# Patient Record
Sex: Male | Born: 1985 | Race: Black or African American | Hispanic: No | Marital: Single
Health system: Southern US, Community
[De-identification: ages and names within clinical notes are randomized; demographics above are authoritative.]

---
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
## 2015-02-04 ENCOUNTER — Encounter (HOSPITAL_COMMUNITY): Payer: Self-pay | Admitting: Emergency Medicine

## 2015-02-04 ENCOUNTER — Other Ambulatory Visit (HOSPITAL_COMMUNITY)
Admission: RE | Admit: 2015-02-04 | Discharge: 2015-02-04 | Disposition: A | Discharge status: Home / Self Care | Payer: Self-pay | Source: Ambulatory Visit | Attending: Family Medicine | Admitting: Family Medicine

## 2015-02-04 ENCOUNTER — Emergency Department (INDEPENDENT_AMBULATORY_CARE_PROVIDER_SITE_OTHER)
Admission: EM | Admit: 2015-02-04 | Discharge: 2015-02-04 | Disposition: A | Discharge status: Home / Self Care | Payer: Self-pay | Source: Home / Self Care | Attending: Family Medicine | Admitting: Family Medicine

## 2015-02-04 DIAGNOSIS — Z113 Encounter for screening for infections with a predominantly sexual mode of transmission: Secondary | ICD-10-CM | POA: Insufficient documentation

## 2015-02-04 DIAGNOSIS — Z202 Contact with and (suspected) exposure to infections with a predominantly sexual mode of transmission: Secondary | ICD-10-CM

## 2015-02-04 MED ORDER — METRONIDAZOLE 500 MG PO TABS: 1000.0000 mg | ORAL_TABLET | Freq: Two times a day (BID) | ORAL | Status: DC

## 2015-02-04 NOTE — ED Provider Notes (Signed)
CSN: [id number]     Arrival date & time 02/04/15  1450 History   First MD Initiated Contact with Patient 02/04/15 1614     Chief Complaint  Patient presents with  . Exposure to STD   (Consider location/radiation/quality/duration/timing/severity/associated sxs/prior Treatment) HPI      29 year old male presents complaining of exposure to trichomonas. He was called by a sexual partner and was told that they tested positive for trichomonas. He had intercourse with this person one week ago. He denies any symptoms. He is heterosexual, only has intercourse with females. He was recently incarcerated, had negative STD tests after being released from prison.  History reviewed. No pertinent past medical history. History reviewed. No pertinent past surgical history. No family history on file. History  Substance Use Topics  . Smoking status: Never Smoker   . Smokeless tobacco: Not on file  . Alcohol Use: No    Review of Systems  Constitutional: Negative for fever, chills and fatigue.  HENT: Negative for sore throat.   Eyes: Negative for visual disturbance.  Respiratory: Negative for cough and shortness of breath.   Cardiovascular: Negative for chest pain, palpitations and leg swelling.  Gastrointestinal: Negative for nausea, vomiting, abdominal pain, diarrhea and constipation.  Genitourinary: Negative for dysuria, urgency, frequency and hematuria.  Musculoskeletal: Negative for myalgias, arthralgias, neck pain and neck stiffness.  Skin: Negative for rash.  Neurological: Negative for dizziness, weakness and light-headedness.  All other systems reviewed and are negative.   Allergies  Review of patient's allergies indicates no known allergies.  Home Medications   Prior to Admission medications   Medication Sig Start Date End Date Taking? Authorizing Provider  metroNIDAZOLE (FLAGYL) 500 MG tablet Take 2 tablets (1,000 mg total) by mouth every 12 (twelve) hours. 02/04/15   Adrian BlackwaterZachary H Alenna Russell, PA-C    BP 120/72 mmHg  Pulse 58  Temp(Src) 97.6 F (36.4 C) (Oral)  Resp 18  SpO2 95% Physical Exam  Constitutional: He is oriented to person, place, and time. He appears well-developed and well-nourished. No distress.  HENT:  Head: Normocephalic.  Pulmonary/Chest: Effort normal. No respiratory distress.  Neurological: He is alert and oriented to person, place, and time. Coordination normal.  Skin: Skin is warm and dry. No rash noted. He is not diaphoretic.  Psychiatric: He has a normal mood and affect. Judgment normal.  Nursing note and vitals reviewed.   ED Course  Procedures (including critical care time) Labs Review Labs Reviewed  RPR  HIV ANTIBODY (ROUTINE TESTING)  URINE CYTOLOGY ANCILLARY ONLY    Imaging Review No results found.   MDM   1. Exposure to trichomonas    Urine cytology, HIV, and RPR have been sent today. Treat with metronidazole, 1 g every 12 hours for 2 doses. Follow-up when necessary  Meds ordered this encounter  Medications  . metroNIDAZOLE (FLAGYL) 500 MG tablet    Sig: Take 2 tablets (1,000 mg total) by mouth every 12 (twelve) hours.    Dispense:  4 tablet    Refill:  0       Graylon GoodZachary H Tekia Waterbury, PA-C 02/04/15 2152

## 2015-02-04 NOTE — Discharge Instructions (Signed)
Sexually Transmitted Disease °A sexually transmitted disease (STD) is a disease or infection that may be passed (transmitted) from person to person, usually during sexual activity. This may happen by way of saliva, semen, blood, vaginal mucus, or urine. Common STDs include:  °· Gonorrhea.   °· Chlamydia.   °· Syphilis.   °· HIV and AIDS.   °· Genital herpes.   °· Hepatitis B and C.   °· Trichomonas.   °· Human papillomavirus (HPV).   °· Pubic lice.   °· Scabies. °· Mites. °· Bacterial vaginosis. °WHAT ARE CAUSES OF STDs? °An STD may be caused by bacteria, a virus, or parasites. STDs are often transmitted during sexual activity if one person is infected. However, they may also be transmitted through nonsexual means. STDs may be transmitted after:  °· Sexual intercourse with an infected person.   °· Sharing sex toys with an infected person.   °· Sharing needles with an infected person or using unclean piercing or tattoo needles. °· Having intimate contact with the genitals, mouth, or rectal areas of an infected person.   °· Exposure to infected fluids during birth. °WHAT ARE THE SIGNS AND SYMPTOMS OF STDs? °Different STDs have different symptoms. Some people may not have any symptoms. If symptoms are present, they may include:  °· Painful or bloody urination.   °· Pain in the pelvis, abdomen, vagina, anus, throat, or eyes.   °· A skin rash, itching, or irritation. °· Growths, ulcerations, blisters, or sores in the genital and anal areas. °· Abnormal vaginal discharge with or without bad odor.   °· Penile discharge in men.   °· Fever.   °· Pain or bleeding during sexual intercourse.   °· Swollen glands in the groin area.   °· Yellow skin and eyes (jaundice). This is seen with hepatitis.   °· Swollen testicles. °· Infertility. °· Sores and blisters in the mouth. °HOW ARE STDs DIAGNOSED? °To make a diagnosis, your health care provider may:  °· Take a medical history.   °· Perform a physical exam.   °· Take a sample of  any discharge to examine. °· Swab the throat, cervix, opening to the penis, rectum, or vagina for testing. °· Test a sample of your first morning urine.   °· Perform blood tests.   °· Perform a Pap test, if this applies.   °· Perform a colposcopy.   °· Perform a laparoscopy.   °HOW ARE STDs TREATED? ° Treatment depends on the STD. Some STDs may be treated but not cured.  °· Chlamydia, gonorrhea, trichomonas, and syphilis can be cured with antibiotic medicine.   °· Genital herpes, hepatitis, and HIV can be treated, but not cured, with prescribed medicines. The medicines lessen symptoms.   °· Genital warts from HPV can be treated with medicine or by freezing, burning (electrocautery), or surgery. Warts may come back.   °· HPV cannot be cured with medicine or surgery. However, abnormal areas may be removed from the cervix, vagina, or vulva.   °· If your diagnosis is confirmed, your recent sexual partners need treatment. This is true even if they are symptom-free or have a negative culture or evaluation. They should not have sex until their health care providers say it is okay. °HOW CAN I REDUCE MY RISK OF GETTING AN STD? °Take these steps to reduce your risk of getting an STD: °· Use latex condoms, dental dams, and water-soluble lubricants during sexual activity. Do not use petroleum jelly or oils. °· Avoid having multiple sex partners. °· Do not have sex with someone who has other sex partners. °· Do not have sex with anyone you do not know or who is at   high risk for an STD. °· Avoid risky sex practices that can break your skin. °· Do not have sex if you have open sores on your mouth or skin. °· Avoid drinking too much alcohol or taking illegal drugs. Alcohol and drugs can affect your judgment and put you in a vulnerable position. °· Avoid engaging in oral and anal sex acts. °· Get vaccinated for HPV and hepatitis. If you have not received these vaccines in the past, talk to your health care provider about whether one  or both might be right for you.   °· If you are at risk of being infected with HIV, it is recommended that you take a prescription medicine daily to prevent HIV infection. This is called pre-exposure prophylaxis (PrEP). You are considered at risk if: °· You are a man who has sex with other men (MSM). °· You are a heterosexual man or woman and are sexually active with more than one partner. °· You take drugs by injection. °· You are sexually active with a partner who has HIV. °· Talk with your health care provider about whether you are at high risk of being infected with HIV. If you choose to begin PrEP, you should first be tested for HIV. You should then be tested every 3 months for as long as you are taking PrEP.   °WHAT SHOULD I DO IF I THINK I HAVE AN STD? °· See your health care provider.   °· Tell your sexual partner(s). They should be tested and treated for any STDs. °· Do not have sex until your health care provider says it is okay.  °WHEN SHOULD I GET IMMEDIATE MEDICAL CARE? °Contact your health care provider right away if:  °· You have severe abdominal pain. °· You are a man and notice swelling or pain in your testicles. °· You are a woman and notice swelling or pain in your vagina. °Document Released: 01/05/2003 Document Revised: 10/20/2013 Document Reviewed: 05/05/2013 °ExitCare® Patient Information ©2015 [employer]. This information is not intended to replace advice given to you by your health care provider. Make sure you discuss any questions you have with your health care provider. ° °Trichomoniasis °Trichomoniasis is an infection caused by an organism called Trichomonas. The infection can affect both women and men. In women, the outer male genitalia and the vagina are affected. In men, the penis is mainly affected, but the prostate and other reproductive organs can also be involved. Trichomoniasis is a sexually transmitted infection (STI) and is most often passed to another person through sexual  contact.  °RISK FACTORS °· Having unprotected sexual intercourse. °· Having sexual intercourse with an infected partner. °SIGNS AND SYMPTOMS  °Symptoms of trichomoniasis in women include: °· Abnormal gray-green frothy vaginal discharge. °· Itching and irritation of the vagina. °· Itching and irritation of the area outside the vagina. °Symptoms of trichomoniasis in men include:  °· Penile discharge with or without pain. °· Pain during urination. This results from inflammation of the urethra. °DIAGNOSIS  °Trichomoniasis may be found during a Pap test or physical exam. Your health care provider may use one of the following methods to help diagnose this infection: °· Examining vaginal discharge under a microscope. For men, urethral discharge would be examined. °· Testing the pH of the vagina with a test tape. °· Using a vaginal swab test that checks for the Trichomonas organism. A test is available that provides results within a few minutes. °· Doing a culture test for the organism. This is not usually needed. °  TREATMENT  °· You may be given medicine to fight the infection. Women should inform their health care provider if they could be or are pregnant. Some medicines used to treat the infection should not be taken during pregnancy. °· Your health care provider may recommend over-the-counter medicines or creams to decrease itching or irritation. °· Your sexual partner will need to be treated if infected. °HOME CARE INSTRUCTIONS  °· Take medicines only as directed by your health care provider. °· Take over-the-counter medicine for itching or irritation as directed by your health care provider. °· Do not have sexual intercourse while you have the infection. °· Women should not douche or wear tampons while they have the infection. °· Discuss your infection with your partner. Your partner may have gotten the infection from you, or you may have gotten it from your partner. °· Have your sex partner get examined and treated if  necessary. °· Practice safe, informed, and protected sex. °· See your health care provider for other STI testing. °SEEK MEDICAL CARE IF:  °· You still have symptoms after you finish your medicine. °· You develop abdominal pain. °· You have pain when you urinate. °· You have bleeding after sexual intercourse. °· You develop a rash. °· Your medicine makes you sick or makes you throw up (vomit). °MAKE SURE YOU: °· Understand these instructions. °· Will watch your condition. °· Will get help right away if you are not doing well or get worse. °Document Released: 04/10/2001 Document Revised: 03/01/2014 Document Reviewed: 07/27/2013 °ExitCare® Patient Information ©2015 [employer]. This information is not intended to replace advice given to you by your health care provider. Make sure you discuss any questions you have with your health care provider. ° °

## 2015-02-04 NOTE — ED Notes (Signed)
Patient reports his partner told him that they were diagnosed with Trichomoniasis a few days ago. Patient denies having any symptoms. NAD.

## 2015-02-06 LAB — HIV ANTIBODY (ROUTINE TESTING W REFLEX): HIV SCREEN 4TH GENERATION: NONREACTIVE

## 2015-02-07 LAB — URINE CYTOLOGY ANCILLARY ONLY
Chlamydia: NEGATIVE
NEISSERIA GONORRHEA: NEGATIVE
Trichomonas: NEGATIVE

## 2015-02-07 LAB — RPR: RPR: NONREACTIVE

## 2015-02-24 ENCOUNTER — Encounter (HOSPITAL_COMMUNITY): Payer: Self-pay | Admitting: *Deleted

## 2015-02-24 ENCOUNTER — Emergency Department (HOSPITAL_COMMUNITY)
Admission: EM | Admit: 2015-02-24 | Discharge: 2015-02-24 | Disposition: A | Discharge status: Home / Self Care | Payer: Self-pay | Attending: Emergency Medicine | Admitting: Emergency Medicine

## 2015-02-24 DIAGNOSIS — K047 Periapical abscess without sinus: Secondary | ICD-10-CM | POA: Insufficient documentation

## 2015-02-24 DIAGNOSIS — K029 Dental caries, unspecified: Secondary | ICD-10-CM | POA: Insufficient documentation

## 2015-02-24 MED ORDER — CLINDAMYCIN HCL 300 MG PO CAPS: 300.0000 mg | ORAL_CAPSULE | Freq: Four times a day (QID) | ORAL | Status: DC

## 2015-02-24 NOTE — Discharge Instructions (Signed)
Apply warm compresses to jaw throughout the day. Take antibiotic until finished. Use tylenol or motrin for pain. Followup with a dentist is very important for ongoing evaluation and management of recurrent dental pain. Call the dentist TOMORROW to have further evaluation and management. STOP SMOKING! Return to emergency department for emergent changing or worsening symptoms.    Dental Abscess A dental abscess is a collection of infected fluid (pus) from a bacterial infection in the inner part of the tooth (pulp). It usually occurs at the end of the tooth's root.  CAUSES   Severe tooth decay.  Trauma to the tooth that allows bacteria to enter into the pulp, such as a broken or chipped tooth. SYMPTOMS   Severe pain in and around the infected tooth.  Swelling and redness around the abscessed tooth or in the mouth or face.  Tenderness.  Pus drainage.  Bad breath.  Bitter taste in the mouth.  Difficulty swallowing.  Difficulty opening the mouth.  Nausea.  Vomiting.  Chills.  Swollen neck glands. DIAGNOSIS   A medical and dental history will be taken.  An examination will be performed by tapping on the abscessed tooth.  X-rays may be taken of the tooth to identify the abscess. TREATMENT The goal of treatment is to eliminate the infection. You may be prescribed antibiotic medicine to stop the infection from spreading. A root canal may be performed to save the tooth. If the tooth cannot be saved, it may be pulled (extracted) and the abscess may be drained.  HOME CARE INSTRUCTIONS  Only take over-the-counter or prescription medicines for pain, fever, or discomfort as directed by your caregiver.  Rinse your mouth (gargle) often with salt water ( tsp salt in 8 oz [250 ml] of warm water) to relieve pain or swelling.  Do not drive after taking pain medicine (narcotics).  Do not apply heat to the outside of your face.  Return to your dentist for further treatment as  directed. SEEK MEDICAL CARE IF:  Your pain is not helped by medicine.  Your pain is getting worse instead of better. SEEK IMMEDIATE MEDICAL CARE IF:  You have a fever or persistent symptoms for more than 2-3 days.  You have a fever and your symptoms suddenly get worse.  You have chills or a very bad headache.  You have problems breathing or swallowing.  You have trouble opening your mouth.  You have swelling in the neck or around the eye. Document Released: 10/15/2005 Document Revised: 07/09/2012 Document Reviewed: 01/23/2011 [facility] Patient Information 2015 [place], [place]. This information is not intended to replace advice given to you by your health care provider. Make sure you discuss any questions you have with your health care provider.  Dental Care and Dentist Visits Dental care supports good overall health. Regular dental visits can also help you avoid dental pain, bleeding, infection, and other more serious health problems in the future. It is important to keep the mouth healthy because diseases in the teeth, gums, and other oral tissues can spread to other areas of the body. Some problems, such as diabetes, heart disease, and pre-term labor have been associated with poor oral health.  See your dentist every 6 months. If you experience emergency problems such as a toothache or broken tooth, go to the dentist right away. If you see your dentist regularly, you may catch problems early. It is easier to be treated for problems in the early stages.  WHAT TO EXPECT AT A DENTIST VISIT  Your  dentist will look for many common oral health problems and recommend proper treatment. At your regular dental visit, you can expect:  Gentle cleaning of the teeth and gums. This includes scraping and polishing. This helps to remove the sticky substance around the teeth and gums (plaque). Plaque forms in the mouth shortly after eating. Over time, plaque hardens on the teeth as tartar. If tartar is  not removed regularly, it can cause problems. Cleaning also helps remove stains.  Periodic X-rays. These pictures of the teeth and supporting bone will help your dentist assess the health of your teeth.  Periodic fluoride treatments. Fluoride is a natural mineral shown to help strengthen teeth. Fluoride treatmentinvolves applying a fluoride gel or varnish to the teeth. It is most commonly done in children.  Examination of the mouth, tongue, jaws, teeth, and gums to look for any oral health problems, such as:  Cavities (dental caries). This is decay on the tooth caused by plaque, sugar, and acid in the mouth. It is best to catch a cavity when it is small.  Inflammation of the gums caused by plaque buildup (gingivitis).  Problems with the mouth or malformed or misaligned teeth.  Oral cancer or other diseases of the soft tissues or jaws. KEEP YOUR TEETH AND GUMS HEALTHY For healthy teeth and gums, follow these general guidelines as well as your dentist's specific advice:  Have your teeth professionally cleaned at the dentist every 6 months.  Brush twice daily with a fluoride toothpaste.  Floss your teeth daily.  Ask your dentist if you need fluoride supplements, treatments, or fluoride toothpaste.  Eat a healthy diet. Reduce foods and drinks with added sugar.  Avoid smoking. TREATMENT FOR ORAL HEALTH PROBLEMS If you have oral health problems, treatment varies depending on the conditions present in your teeth and gums.  Your caregiver will most likely recommend good oral hygiene at each visit.  For cavities, gingivitis, or other oral health disease, your caregiver will perform a procedure to treat the problem. This is typically done at a separate appointment. Sometimes your caregiver will refer you to another dental specialist for specific tooth problems or for surgery. SEEK IMMEDIATE DENTAL CARE IF:  You have pain, bleeding, or soreness in the gum, tooth, jaw, or mouth area.  A  permanent tooth becomes loose or separated from the gum socket.  You experience a blow or injury to the mouth or jaw area. Document Released: 06/27/2011 Document Revised: 01/07/2012 Document Reviewed: 06/27/2011 [facility] Patient Information 2015 [place], [place]. This information is not intended to replace advice given to you by your health care provider. Make sure you discuss any questions you have with your health care provider.

## 2015-02-24 NOTE — ED Provider Notes (Signed)
CSN: [id number]     Arrival date & time 02/24/15  2109 History  This chart was scribed for non-physician practitioner working with Blake Divine, MD by Richarda Overlie, ED Scribe. This patient was seen in room TR03C/TR03C and the patient's care was started at 10:28 PM.   Chief Complaint  Patient presents with  . Dental Pain   Patient is a 29 y.o. male presenting with tooth pain. The history is provided by the patient. No language interpreter was used.  Dental Pain Location:  Upper Upper teeth location:  14/LU 1st molar Quality:  No pain Severity:  No pain Onset quality:  Gradual Duration:  4 days Timing:  Constant Progression:  Unable to specify Context: abscess   Relieved by:  Nothing Worsened by:  Nothing tried Ineffective treatments:  NSAIDs Associated symptoms: facial swelling, gum swelling and oral bleeding   Associated symptoms: no difficulty swallowing, no drooling, no facial pain, no fever, no neck pain, no neck swelling and no trismus   Risk factors: lack of dental care and smoking   Risk factors: no diabetes    HPI Comments: CIEL YANES is a 29 y.o. male with no significant medical history who presents to the Emergency Department complaining of left upper dental problem for the last 4 days. Pt reports he is no pain at this time. He states that his gum is bleeding around his left upper molars. Pt reports associated swelling to the area. He states that he had a filling placed in that area on 10/15. Pt states he is still able to open and close his mouth and is still able to swallow. He states that has been taking his cousin's antibiotics and has tried motrin. Pt reports that he has had an abscessed tooth in the past. He reports a history of smoking. Pt reports NKDA. He says that he is not currently taking any medications. He denies fevers, chills, syncope, dizziness, light headedness, neck pain or swelling, ear pain or drainage, watery eyes, trismus, drooling, difficulty  breathing, CP, SOB, abdominal pain, nausea or vomiting.    History reviewed. No pertinent past medical history. History reviewed. No pertinent past surgical history. History reviewed. No pertinent family history. History  Substance Use Topics  . Smoking status: Never Smoker   . Smokeless tobacco: Not on file  . Alcohol Use: No    Review of Systems  Constitutional: Negative for fever and chills.  HENT: Positive for dental problem and facial swelling. Negative for drooling, ear discharge, ear pain, rhinorrhea, sore throat and trouble swallowing.   Respiratory: Negative for shortness of breath.   Cardiovascular: Negative for chest pain.  Gastrointestinal: Negative for nausea, vomiting, abdominal pain and diarrhea.  Musculoskeletal: Negative for myalgias, arthralgias and neck pain.  Skin: Negative for color change.  Allergic/Immunologic: Negative for immunocompromised state.  Neurological: Negative for dizziness, syncope, weakness, light-headedness and numbness.  Psychiatric/Behavioral: Negative for confusion.    10 Systems reviewed and all are negative for acute change except as noted in the HPI.  Allergies  Review of patient's allergies indicates no known allergies.  Home Medications   Prior to Admission medications   Medication Sig Start Date End Date Taking? Authorizing Provider  metroNIDAZOLE (FLAGYL) 500 MG tablet Take 2 tablets (1,000 mg total) by mouth every 12 (twelve) hours. 02/04/15   Adrian Blackwater Baker, PA-C   BP 121/78 mmHg  Pulse 83  Temp(Src) 98.4 F (36.9 C) (Oral)  Resp 20  Wt 196 lb (88.905 kg)  SpO2 100% Physical  Exam  Constitutional: He is oriented to person, place, and time. Vital signs are normal. He appears well-developed and well-nourished.  Non-toxic appearance. No distress.  Afebrile, nontoxic, NAD  HENT:  Head: Normocephalic and atraumatic.  Nose: Nose normal.  Mouth/Throat: Uvula is midline, oropharynx is clear and moist and mucous membranes are  normal. No trismus in the jaw. Dental abscesses and dental caries present. No uvula swelling. No oropharyngeal exudate.  Dental abscess adjacent to LU molar #14, actively draining purulent material, oropharynx clear without uvula swelling or deviation, no trismus or drooling. Diffuse dental decay. No TTP to dentitia. Subfloor of mouth without induration or tenderness  Eyes: Conjunctivae and EOM are normal. Right eye exhibits no discharge. Left eye exhibits no discharge.  Neck: Normal range of motion. Neck supple.  Cardiovascular: Normal rate.   Pulmonary/Chest: Effort normal. No respiratory distress.  Abdominal: Normal appearance. He exhibits no distension.  Musculoskeletal: Normal range of motion.  Lymphadenopathy:       Head (right side): No submandibular and no tonsillar adenopathy present.       Head (left side): No submandibular and no tonsillar adenopathy present.    He has no cervical adenopathy.  No head/neck LAD  Neurological: He is alert and oriented to person, place, and time. He has normal strength. No sensory deficit.  Skin: Skin is warm, dry and intact. No rash noted.  Psychiatric: He has a normal mood and affect.  Nursing note and vitals reviewed.   ED Course  Procedures   DIAGNOSTIC STUDIES: Oxygen Saturation is 100% on RA, normal by my interpretation.    COORDINATION OF CARE: 10:35 PM Discussed treatment plan with pt at bedside and pt agreed to plan.   Labs Review Labs Reviewed - No data to display  Imaging Review No results found.   EKG Interpretation None      MDM   Final diagnoses:  Dental abscess    29 y.o. male here with dental abscess, no pain. Actively draining purulent drainage, no need for I&D or aspiration since this is draining well. Afebrile and nontoxic, swallowing secretions well. Discussed salt water swishes and will start on abx. Given referral to dentistry for ongoing management. No evidence of PTA or ludwig's, no airway compromise.  Strict return precautions given. I explained the diagnosis and have given explicit precautions to return to the ER including for any other new or worsening symptoms. The patient understands and accepts the medical plan as it's been dictated and I have answered their questions. Discharge instructions concerning home care and prescriptions have been given. The patient is STABLE and is discharged to home in good condition.   I personally performed the services described in this documentation, which was scribed in my presence. The recorded information has been reviewed and is accurate.  BP 121/78 mmHg  Pulse 83  Temp(Src) 98.4 F (36.9 C) (Oral)  Resp 20  Wt 196 lb (88.905 kg)  SpO2 100%  Meds ordered this encounter  Medications  . clindamycin (CLEOCIN) 300 MG capsule    Sig: Take 1 capsule (300 mg total) by mouth 4 (four) times daily. X 7 days    Dispense:  28 capsule    Refill:  0    Order Specific Question:  Supervising Provider    Answer:  Eber HongMILLER, BRIAN [[id number]]       Shalan Neault Camprubi-Soms, PA-C 02/24/15 2244  Blake DivineJohn Wofford, MD 02/26/15 1229

## 2015-02-24 NOTE — ED Notes (Signed)
Pt in c/o toothache for the last few days, thinks it is abscessed, no distress noted

## 2018-09-29 ENCOUNTER — Encounter (HOSPITAL_COMMUNITY): Payer: Self-pay | Admitting: Emergency Medicine

## 2018-09-29 ENCOUNTER — Ambulatory Visit (HOSPITAL_COMMUNITY)
Admission: EM | Admit: 2018-09-29 | Discharge: 2018-09-29 | Disposition: A | Discharge status: Home / Self Care | Payer: Self-pay

## 2018-09-29 DIAGNOSIS — M25512 Pain in left shoulder: Secondary | ICD-10-CM
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
## 2018-09-29 NOTE — Discharge Instructions (Signed)
I believe that your pain is musculoskeletal There is no reason for x-rays today If the pain continues or gets worse he may want to follow-up with orthopedic He can do heat/ice to the area, gentle massage, gentle stretching Follow up as needed for continued or worsening symptoms
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
## 2020-05-26 ENCOUNTER — Inpatient Hospital Stay (HOSPITAL_COMMUNITY): Payer: No Typology Code available for payment source | Admitting: Anesthesiology

## 2020-05-26 ENCOUNTER — Emergency Department (HOSPITAL_COMMUNITY): Payer: No Typology Code available for payment source

## 2020-05-26 ENCOUNTER — Inpatient Hospital Stay (HOSPITAL_COMMUNITY)
Admission: EM | Admit: 2020-05-26 | Discharge: 2020-06-07 | DRG: 957 | Disposition: A | Discharge status: Home / Self Care | Payer: No Typology Code available for payment source | Attending: Surgery | Admitting: Surgery

## 2020-05-26 ENCOUNTER — Inpatient Hospital Stay (HOSPITAL_COMMUNITY): Payer: No Typology Code available for payment source

## 2020-05-26 ENCOUNTER — Encounter (HOSPITAL_COMMUNITY): Payer: Self-pay

## 2020-05-26 ENCOUNTER — Encounter (HOSPITAL_COMMUNITY): Admission: EM | Disposition: A | Discharge status: Home / Self Care | Payer: Self-pay | Source: Home / Self Care

## 2020-05-26 DIAGNOSIS — S272XXA Traumatic hemopneumothorax, initial encounter: Secondary | ICD-10-CM | POA: Diagnosis present

## 2020-05-26 DIAGNOSIS — D62 Acute posthemorrhagic anemia: Secondary | ICD-10-CM | POA: Diagnosis present

## 2020-05-26 DIAGNOSIS — S21131A Puncture wound without foreign body of right front wall of thorax without penetration into thoracic cavity, initial encounter: Secondary | ICD-10-CM | POA: Diagnosis present

## 2020-05-26 DIAGNOSIS — S36113A Laceration of liver, unspecified degree, initial encounter: Secondary | ICD-10-CM

## 2020-05-26 DIAGNOSIS — J850 Gangrene and necrosis of lung: Secondary | ICD-10-CM | POA: Diagnosis present

## 2020-05-26 DIAGNOSIS — I10 Essential (primary) hypertension: Secondary | ICD-10-CM | POA: Diagnosis present

## 2020-05-26 DIAGNOSIS — Y9259 Other trade areas as the place of occurrence of the external cause: Secondary | ICD-10-CM | POA: Diagnosis not present

## 2020-05-26 DIAGNOSIS — W3400XA Accidental discharge from unspecified firearms or gun, initial encounter: Secondary | ICD-10-CM

## 2020-05-26 DIAGNOSIS — J189 Pneumonia, unspecified organism: Secondary | ICD-10-CM | POA: Diagnosis present

## 2020-05-26 DIAGNOSIS — K7689 Other specified diseases of liver: Secondary | ICD-10-CM | POA: Diagnosis present

## 2020-05-26 DIAGNOSIS — R451 Restlessness and agitation: Secondary | ICD-10-CM | POA: Diagnosis not present

## 2020-05-26 DIAGNOSIS — S36112A Contusion of liver, initial encounter: Secondary | ICD-10-CM | POA: Diagnosis present

## 2020-05-26 DIAGNOSIS — S37039A Laceration of unspecified kidney, unspecified degree, initial encounter: Secondary | ICD-10-CM

## 2020-05-26 DIAGNOSIS — J9691 Respiratory failure, unspecified with hypoxia: Secondary | ICD-10-CM | POA: Diagnosis present

## 2020-05-26 DIAGNOSIS — R140 Abdominal distension (gaseous): Secondary | ICD-10-CM

## 2020-05-26 DIAGNOSIS — K567 Ileus, unspecified: Secondary | ICD-10-CM | POA: Diagnosis not present

## 2020-05-26 DIAGNOSIS — R52 Pain, unspecified: Secondary | ICD-10-CM

## 2020-05-26 DIAGNOSIS — E875 Hyperkalemia: Secondary | ICD-10-CM | POA: Diagnosis present

## 2020-05-26 DIAGNOSIS — I959 Hypotension, unspecified: Secondary | ICD-10-CM | POA: Diagnosis not present

## 2020-05-26 DIAGNOSIS — D6959 Other secondary thrombocytopenia: Secondary | ICD-10-CM | POA: Diagnosis present

## 2020-05-26 DIAGNOSIS — Z0189 Encounter for other specified special examinations: Secondary | ICD-10-CM

## 2020-05-26 DIAGNOSIS — Z9889 Other specified postprocedural states: Secondary | ICD-10-CM

## 2020-05-26 DIAGNOSIS — S32009B Unspecified fracture of unspecified lumbar vertebra, initial encounter for open fracture: Secondary | ICD-10-CM

## 2020-05-26 DIAGNOSIS — S37031A Laceration of right kidney, unspecified degree, initial encounter: Secondary | ICD-10-CM | POA: Diagnosis present

## 2020-05-26 DIAGNOSIS — Z20822 Contact with and (suspected) exposure to covid-19: Secondary | ICD-10-CM | POA: Diagnosis present

## 2020-05-26 DIAGNOSIS — J90 Pleural effusion, not elsewhere classified: Secondary | ICD-10-CM

## 2020-05-26 DIAGNOSIS — S21239A Puncture wound without foreign body of unspecified back wall of thorax without penetration into thoracic cavity, initial encounter: Secondary | ICD-10-CM | POA: Diagnosis present

## 2020-05-26 DIAGNOSIS — J942 Hemothorax: Secondary | ICD-10-CM

## 2020-05-26 DIAGNOSIS — J939 Pneumothorax, unspecified: Secondary | ICD-10-CM

## 2020-05-26 DIAGNOSIS — S32019A Unspecified fracture of first lumbar vertebra, initial encounter for closed fracture: Secondary | ICD-10-CM | POA: Diagnosis present

## 2020-05-26 DIAGNOSIS — Y249XXA Unspecified firearm discharge, undetermined intent, initial encounter: Secondary | ICD-10-CM

## 2020-05-26 DIAGNOSIS — Z09 Encounter for follow-up examination after completed treatment for conditions other than malignant neoplasm: Secondary | ICD-10-CM

## 2020-05-26 DIAGNOSIS — K660 Peritoneal adhesions (postprocedural) (postinfection): Secondary | ICD-10-CM | POA: Diagnosis present

## 2020-05-26 DIAGNOSIS — R310 Gross hematuria: Secondary | ICD-10-CM | POA: Diagnosis present

## 2020-05-26 DIAGNOSIS — N179 Acute kidney failure, unspecified: Secondary | ICD-10-CM | POA: Diagnosis present

## 2020-05-26 DIAGNOSIS — J869 Pyothorax without fistula: Secondary | ICD-10-CM

## 2020-05-26 DIAGNOSIS — Z9689 Presence of other specified functional implants: Secondary | ICD-10-CM

## 2020-05-26 HISTORY — PX: IR ANGIOGRAM SELECTIVE EACH ADDITIONAL VESSEL: IMG667

## 2020-05-26 HISTORY — PX: IR FLUORO GUIDE CV LINE RIGHT: IMG2283

## 2020-05-26 HISTORY — PX: IR EMBO ART  VEN HEMORR LYMPH EXTRAV  INC GUIDE ROADMAPPING: IMG5450

## 2020-05-26 HISTORY — PX: IR ANGIOGRAM VISCERAL SELECTIVE: IMG657

## 2020-05-26 HISTORY — PX: IR US GUIDE VASC ACCESS RIGHT: IMG2390

## 2020-05-26 HISTORY — PX: RADIOLOGY WITH ANESTHESIA: SHX6223

## 2020-05-26 HISTORY — PX: IR RENAL SUPRASEL UNI S&I MOD SED: IMG655

## 2020-05-26 LAB — COMPREHENSIVE METABOLIC PANEL
ALT: 134 U/L — ABNORMAL HIGH (ref 0–44)
AST: 144 U/L — ABNORMAL HIGH (ref 15–41)
Albumin: 4.6 g/dL (ref 3.5–5.0)
Alkaline Phosphatase: 73 U/L (ref 38–126)
Anion gap: 16 — ABNORMAL HIGH (ref 5–15)
BUN: 13 mg/dL (ref 6–20)
CO2: 23 mmol/L (ref 22–32)
Calcium: 10 mg/dL (ref 8.9–10.3)
Chloride: 105 mmol/L (ref 98–111)
Creatinine, Ser: 1.39 mg/dL — ABNORMAL HIGH (ref 0.61–1.24)
GFR calc Af Amer: >60 mL/min (ref >60)
GFR calc non Af Amer: >60 mL/min (ref >60)
Glucose, Bld: 109 mg/dL — ABNORMAL HIGH (ref 70–99)
Potassium: 3.7 mmol/L (ref 3.5–5.1)
Sodium: 144 mmol/L (ref 135–145)
Total Bilirubin: 0.6 mg/dL (ref 0.3–1.2)
Total Protein: 7.2 g/dL (ref 6.5–8.1)

## 2020-05-26 LAB — POCT I-STAT, CHEM 8
BUN: 16 mg/dL (ref 6–20)
Calcium, Ion: 1.14 mmol/L — ABNORMAL LOW (ref 1.15–1.40)
Chloride: 105 mmol/L (ref 98–111)
Creatinine, Ser: 1.2 mg/dL (ref 0.61–1.24)
Glucose, Bld: 181 mg/dL — ABNORMAL HIGH (ref 70–99)
HCT: 20 % — ABNORMAL LOW (ref 39.0–52.0)
Hemoglobin: 6.8 g/dL — CL (ref 13.0–17.0)
Potassium: 3.9 mmol/L (ref 3.5–5.1)
Sodium: 143 mmol/L (ref 135–145)
TCO2: 25 mmol/L (ref 22–32)

## 2020-05-26 LAB — CBC WITH DIFFERENTIAL/PLATELET
Abs Immature Granulocytes: 0.05 10*3/uL (ref 0.00–0.07)
Basophils Absolute: 0 10*3/uL (ref 0.0–0.1)
Basophils Relative: 1 %
Eosinophils Absolute: 0.1 10*3/uL (ref 0.0–0.5)
Eosinophils Relative: 1 %
HCT: 44.6 % (ref 39.0–52.0)
Hemoglobin: 14.3 g/dL (ref 13.0–17.0)
Immature Granulocytes: 1 %
Lymphocytes Relative: 52 %
Lymphs Abs: 3.1 10*3/uL (ref 0.7–4.0)
MCH: 30.6 pg (ref 26.0–34.0)
MCHC: 32.1 g/dL (ref 30.0–36.0)
MCV: 95.3 fL (ref 80.0–100.0)
Monocytes Absolute: 0.5 10*3/uL (ref 0.1–1.0)
Monocytes Relative: 8 %
Neutro Abs: 2.2 10*3/uL (ref 1.7–7.7)
Neutrophils Relative %: 37 %
Platelets: 134 10*3/uL — ABNORMAL LOW (ref 150–400)
RBC: 4.68 MIL/uL (ref 4.22–5.81)
RDW: 12 % (ref 11.5–15.5)
WBC: 6 10*3/uL (ref 4.0–10.5)
nRBC: 0 % (ref 0.0–0.2)

## 2020-05-26 LAB — I-STAT CHEM 8, ED
BUN: 14 mg/dL (ref 6–20)
Calcium, Ion: 1.19 mmol/L (ref 1.15–1.40)
Chloride: 106 mmol/L (ref 98–111)
Creatinine, Ser: 1.5 mg/dL — ABNORMAL HIGH (ref 0.61–1.24)
Glucose, Bld: 105 mg/dL — ABNORMAL HIGH (ref 70–99)
HCT: 44 % (ref 39.0–52.0)
Hemoglobin: 15 g/dL (ref 13.0–17.0)
Potassium: 3.3 mmol/L — ABNORMAL LOW (ref 3.5–5.1)
Sodium: 145 mmol/L (ref 135–145)
TCO2: 23 mmol/L (ref 22–32)

## 2020-05-26 LAB — SARS CORONAVIRUS 2 BY RT PCR (HOSPITAL ORDER, PERFORMED IN ~~LOC~~ HOSPITAL LAB): SARS Coronavirus 2: NEGATIVE

## 2020-05-26 LAB — PROTIME-INR
INR: 0.9 (ref 0.8–1.2)
Prothrombin Time: 12.2 seconds (ref 11.4–15.2)

## 2020-05-26 SURGERY — IR WITH ANESTHESIA
Anesthesia: General

## 2020-05-26 MED ORDER — FENTANYL CITRATE (PF) 100 MCG/2ML IJ SOLN
INTRAMUSCULAR | Status: AC
  Filled 2020-05-26: qty 2

## 2020-05-26 MED ORDER — ONDANSETRON HCL 4 MG/2ML IJ SOLN: 4.0000 mg | Freq: Once | INTRAMUSCULAR | Status: AC

## 2020-05-26 MED ORDER — FENTANYL BOLUS VIA INFUSION
50.0000 ug | INTRAVENOUS | Status: DC | PRN
  Filled 2020-05-26: qty 50

## 2020-05-26 MED ORDER — PHENYLEPHRINE HCL-NACL 10-0.9 MG/250ML-% IV SOLN
INTRAVENOUS | Status: DC | PRN
Start: 2020-05-26 — End: 2020-05-26
  Administered 2020-05-26: 25 ug/min via INTRAVENOUS

## 2020-05-26 MED ORDER — FENTANYL CITRATE (PF) 100 MCG/2ML IJ SOLN: 50.0000 ug | Freq: Once | INTRAMUSCULAR | Status: AC

## 2020-05-26 MED ORDER — SODIUM CHLORIDE 0.9 % IV BOLUS
1000.0000 mL | Freq: Once | INTRAVENOUS | Status: AC
  Administered 2020-05-26: 1000 mL via INTRAVENOUS

## 2020-05-26 MED ORDER — FENTANYL 2500MCG IN NS 250ML (10MCG/ML) PREMIX INFUSION
50.0000 ug/h | INTRAVENOUS | Status: DC
  Administered 2020-05-26: 50 ug/h via INTRAVENOUS
  Filled 2020-05-26: qty 250

## 2020-05-26 MED ORDER — EPHEDRINE SULFATE 50 MG/ML IJ SOLN
INTRAMUSCULAR | Status: DC | PRN
Start: 2020-05-26 — End: 2020-05-26
  Administered 2020-05-26: 15 mg via INTRAVENOUS

## 2020-05-26 MED ORDER — IOHEXOL 300 MG/ML  SOLN
150.0000 mL | Freq: Once | INTRAMUSCULAR | Status: AC | PRN
  Administered 2020-05-26: 110 mL via INTRA_ARTERIAL

## 2020-05-26 MED ORDER — GELATIN ABSORBABLE 12-7 MM EX MISC
CUTANEOUS | Status: AC
  Filled 2020-05-26: qty 2

## 2020-05-26 MED ORDER — HYDROMORPHONE HCL 1 MG/ML IJ SOLN: 1.0000 mg | Freq: Once | INTRAMUSCULAR | Status: AC

## 2020-05-26 MED ORDER — FENTANYL CITRATE (PF) 100 MCG/2ML IJ SOLN
INTRAMUSCULAR | Status: AC
  Administered 2020-05-26: 50 ug via INTRAVENOUS
  Filled 2020-05-26: qty 2

## 2020-05-26 MED ORDER — PROPOFOL 500 MG/50ML IV EMUL
INTRAVENOUS | Status: DC | PRN
  Administered 2020-05-26: 30 ug/kg/min via INTRAVENOUS

## 2020-05-26 MED ORDER — HYDROMORPHONE HCL 1 MG/ML IJ SOLN
INTRAMUSCULAR | Status: AC
  Administered 2020-05-26: 1 mg via INTRAVENOUS
  Filled 2020-05-26: qty 1

## 2020-05-26 MED ORDER — ROCURONIUM 10MG/ML (10ML) SYRINGE FOR MEDFUSION PUMP - OPTIME
INTRAVENOUS | Status: DC | PRN
  Administered 2020-05-26: 30 mg via INTRAVENOUS
  Administered 2020-05-26: 40 mg via INTRAVENOUS

## 2020-05-26 MED ORDER — PANTOPRAZOLE SODIUM 40 MG PO TBEC: 40.0000 mg | DELAYED_RELEASE_TABLET | Freq: Every day | ORAL | Status: DC

## 2020-05-26 MED ORDER — ONDANSETRON 4 MG PO TBDP: 4.0000 mg | ORAL_TABLET | Freq: Four times a day (QID) | ORAL | Status: DC | PRN

## 2020-05-26 MED ORDER — SODIUM BICARBONATE 8.4 % IV SOLN
INTRAVENOUS | Status: AC
  Filled 2020-05-26: qty 100

## 2020-05-26 MED ORDER — METOPROLOL TARTRATE 5 MG/5ML IV SOLN
5.0000 mg | Freq: Four times a day (QID) | INTRAVENOUS | Status: DC | PRN
  Administered 2020-06-02: 5 mg via INTRAVENOUS
  Filled 2020-05-26: qty 5

## 2020-05-26 MED ORDER — ALBUMIN HUMAN 5 % IV SOLN
INTRAVENOUS | Status: AC
  Filled 2020-05-26: qty 500

## 2020-05-26 MED ORDER — ONDANSETRON HCL 4 MG/2ML IJ SOLN
4.0000 mg | Freq: Four times a day (QID) | INTRAMUSCULAR | Status: DC | PRN
  Administered 2020-05-31: 4 mg via INTRAVENOUS

## 2020-05-26 MED ORDER — PROPOFOL 1000 MG/100ML IV EMUL
0.0000 ug/kg/min | INTRAVENOUS | Status: DC
  Administered 2020-05-27 (×2): 40 ug/kg/min via INTRAVENOUS
  Filled 2020-05-26 (×2): qty 100

## 2020-05-26 MED ORDER — PHENYLEPHRINE HCL-NACL 10-0.9 MG/250ML-% IV SOLN
0.0000 ug/min | INTRAVENOUS | Status: DC
  Administered 2020-05-27 (×2): 50 ug/min via INTRAVENOUS
  Filled 2020-05-26 (×2): qty 250

## 2020-05-26 MED ORDER — PROPOFOL 10 MG/ML IV BOLUS
INTRAVENOUS | Status: DC | PRN
  Administered 2020-05-26: 75 mg via INTRAVENOUS
  Administered 2020-05-26: 120 mg via INTRAVENOUS

## 2020-05-26 MED ORDER — IOHEXOL 300 MG/ML  SOLN
150.0000 mL | Freq: Once | INTRAMUSCULAR | Status: AC | PRN
  Administered 2020-05-26: 30 mL via INTRA_ARTERIAL

## 2020-05-26 MED ORDER — SODIUM CHLORIDE 0.9 % IV SOLN
INTRAVENOUS | Status: DC | PRN
Start: 2020-05-26 — End: 2020-05-26

## 2020-05-26 MED ORDER — SODIUM CHLORIDE 0.9% IV SOLUTION: Freq: Once | INTRAVENOUS | Status: DC

## 2020-05-26 MED ORDER — ALBUMIN HUMAN 5 % IV SOLN
25.0000 g | Freq: Once | INTRAVENOUS | Status: AC
  Administered 2020-05-26: 25 g via INTRAVENOUS

## 2020-05-26 MED ORDER — MIDAZOLAM HCL 2 MG/2ML IJ SOLN
INTRAMUSCULAR | Status: DC | PRN
  Administered 2020-05-26: 2 mg via INTRAVENOUS

## 2020-05-26 MED ORDER — FENTANYL CITRATE (PF) 100 MCG/2ML IJ SOLN
50.0000 ug | Freq: Once | INTRAMUSCULAR | Status: AC
  Administered 2020-05-26: 50 ug via INTRAVENOUS

## 2020-05-26 MED ORDER — HYDROMORPHONE HCL 1 MG/ML IJ SOLN
1.0000 mg | Freq: Once | INTRAMUSCULAR | Status: AC
  Administered 2020-05-26: 1 mg via INTRAVENOUS

## 2020-05-26 MED ORDER — IOHEXOL 300 MG/ML  SOLN
100.0000 mL | Freq: Once | INTRAMUSCULAR | Status: AC | PRN
  Administered 2020-05-26: 100 mL via INTRAVENOUS

## 2020-05-26 MED ORDER — CHLORHEXIDINE GLUCONATE 0.12% ORAL RINSE (MEDLINE KIT)
15.0000 mL | Freq: Two times a day (BID) | OROMUCOSAL | Status: DC
  Administered 2020-05-26 – 2020-05-27 (×2): 15 mL via OROMUCOSAL

## 2020-05-26 MED ORDER — HYDROMORPHONE HCL 2 MG/ML IJ SOLN
INTRAMUSCULAR | Status: AC
  Filled 2020-05-26: qty 1

## 2020-05-26 MED ORDER — SODIUM BICARBONATE 8.4 % IV SOLN
100.0000 meq | Freq: Once | INTRAVENOUS | Status: AC
  Administered 2020-05-26: 100 meq via INTRAVENOUS

## 2020-05-26 MED ORDER — HEPARIN SODIUM (PORCINE) 1000 UNIT/ML IJ SOLN
INTRAMUSCULAR | Status: AC
  Filled 2020-05-26: qty 1

## 2020-05-26 MED ORDER — POLYETHYLENE GLYCOL 3350 17 G PO PACK
17.0000 g | PACK | Freq: Every day | ORAL | Status: DC
  Filled 2020-05-26: qty 1

## 2020-05-26 MED ORDER — ONDANSETRON HCL 4 MG/2ML IJ SOLN
INTRAMUSCULAR | Status: AC
  Administered 2020-05-26: 4 mg via INTRAVENOUS
  Filled 2020-05-26: qty 2

## 2020-05-26 MED ORDER — DOCUSATE SODIUM 50 MG/5ML PO LIQD: 100.0000 mg | Freq: Two times a day (BID) | ORAL | Status: DC

## 2020-05-26 MED ORDER — SUCCINYLCHOLINE 20MG/ML (10ML) SYRINGE FOR MEDFUSION PUMP - OPTIME
INTRAMUSCULAR | Status: DC | PRN
  Administered 2020-05-26: 120 mg via INTRAVENOUS

## 2020-05-26 MED ORDER — PHENYLEPHRINE HCL (PRESSORS) 10 MG/ML IV SOLN
INTRAVENOUS | Status: DC | PRN
Start: 2020-05-26 — End: 2020-05-26
  Administered 2020-05-26 (×4): 80 ug via INTRAVENOUS

## 2020-05-26 MED ORDER — LIDOCAINE HCL 1 % IJ SOLN
INTRAMUSCULAR | Status: AC
  Filled 2020-05-26: qty 20

## 2020-05-26 MED ORDER — PANTOPRAZOLE SODIUM 40 MG IV SOLR: 40.0000 mg | Freq: Every day | INTRAVENOUS | Status: DC

## 2020-05-26 MED ORDER — LACTATED RINGERS IV SOLN: INTRAVENOUS | Status: DC

## 2020-05-26 MED ORDER — FENTANYL CITRATE (PF) 250 MCG/5ML IJ SOLN
INTRAMUSCULAR | Status: DC | PRN
  Administered 2020-05-26 (×2): 50 ug via INTRAVENOUS

## 2020-05-26 MED ORDER — MIDAZOLAM HCL 2 MG/2ML IJ SOLN
INTRAMUSCULAR | Status: AC
  Filled 2020-05-26: qty 2

## 2020-05-26 MED ORDER — ORAL CARE MOUTH RINSE
15.0000 mL | OROMUCOSAL | Status: DC
  Administered 2020-05-27 (×6): 15 mL via OROMUCOSAL
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
## 2020-05-26 NOTE — ED Notes (Signed)
Verbal order for 1 mg dilaudid, 4 mg zofran per MD Delo.
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
## 2020-05-26 NOTE — Sedation Documentation (Signed)
Anesthesia in to sedate, intubate and monitor
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
## 2020-05-26 NOTE — Transfer of Care (Signed)
Immediate Anesthesia Transfer of Care Note  Patient: Brian Duran  Procedure(s) Performed: IR WITH ANESTHESIA (N/A )  Patient Location: NICU  Anesthesia Type:General  Level of Consciousness: Patient remains intubated per anesthesia plan  Airway & Oxygen Therapy: Patient placed on Ventilator (see vital sign flow sheet for setting)  Post-op Assessment: Report given to RN and Post -op Vital signs reviewed and stable  Post vital signs: Reviewed and stable  Last Vitals:  Vitals Value Taken Time  BP 132/114 05/26/20 2231  Temp 35.9 C 05/26/20 2244  Pulse 116 05/26/20 2244  Resp 17 05/26/20 2244  SpO2 99 % 05/26/20 2244  Vitals shown include unvalidated device data.  Last Pain:  Vitals:   05/26/20 1950  TempSrc: Rectal  PainSc:          Complications: No complications documented.

## 2020-05-26 NOTE — ED Notes (Signed)
Pt at CT
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
## 2020-05-26 NOTE — Anesthesia Postprocedure Evaluation (Signed)
Anesthesia Post Note  Patient: Brian Duran  Procedure(s) Performed: IR WITH ANESTHESIA (N/A )     Patient location during evaluation: SICU Anesthesia Type: General Level of consciousness: sedated Pain management: pain level controlled Vital Signs Assessment: post-procedure vital signs reviewed and stable Respiratory status: patient remains intubated per anesthesia plan Cardiovascular status: stable Postop Assessment: no apparent nausea or vomiting Anesthetic complications: no   No complications documented.  Last Vitals:  Vitals:   05/26/20 2236 05/26/20 2327  BP:    Pulse: (!) 123 (!) 118  Resp: (!) 25 18  Temp:  (!) 35.9 C  SpO2: 97% 95%    Last Pain:  Vitals:   05/26/20 1950  TempSrc: Rectal  PainSc:                  Sekai Gitlin,W. EDMOND

## 2020-05-26 NOTE — ED Notes (Signed)
2 paper bags with pt belongings given to [employer] Deputy Allred.

## 2020-05-26 NOTE — Procedures (Signed)
Interventional Radiology Procedure Note  Procedure:   US guided access right CFA for mesenteric angiogram and right renal angiogram.  Coil embolization of 2 pseudoaneurysm of the right renal system Gelfoam embolization of right hepatic artery, S-6/7, S-5/8 Image guided right CFV triple lumen/"big mac"  Exoseal for hemostasis   Complications: None  Recommendations:  - Right right straight until CVC removed - Do not submerge - Routine wound care - NV checks - to icu   Signed,  Dulcy Fanny. Earleen Newport, DO

## 2020-05-26 NOTE — Procedures (Signed)
Insertion of Chest Tube Procedure Note  Kyshaun Barnette  [id number]  Aug 27, 1986  Date:05/26/20  Time:11:13 PM    Provider Performing: Liz Malady   Procedure: Chest Tube Insertion [phone])  Indication(s)R Hemothorax  Consent Unable to obtain consent due to emergent nature of procedure.  Anesthesia sedation and pain medicine   Time Out Verified patient identification, verified procedure, site/side was marked, verified correct patient position, special equipment/implants available, medications/allergies/relevant history reviewed, required imaging and test results available.   Sterile Technique Maximal sterile technique including full sterile barrier drape, hand hygiene, sterile gown, sterile gloves, mask, hair covering, sterile ultrasound probe cover (if used).   Procedure Description Ultrasound not used to identify appropriate pleural anatomy for placement and overlying skin marked. Area of placement cleaned and draped in sterile fashion.  A 28 French chest tube was placed into the right pleural space using Kelly dissection. Appropriate return of blood was obtained.  The tube was connected to atrium and placed on -20 cm H2O wall suction.   Complications/Tolerance None; patient tolerated the procedure well. Chest X-ray is ordered to verify placement.   EBL 200  Specimen(s) none
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
## 2020-05-26 NOTE — Progress Notes (Signed)
ABG done. Results would not cross over. 7.20/43.1/177/16.8/99%. Results given to Dr. Janee Morn.

## 2020-05-26 NOTE — Anesthesia Preprocedure Evaluation (Addendum)
Anesthesia Evaluation  Patient identified by MRN, date of birth, ID band Patient awake    Reviewed: Allergy & Precautions, H&P , NPO status , Patient's Chart, lab work & pertinent test results  Airway Mallampati: II  TM Distance: >3 FB Neck ROM: Full    Dental no notable dental hx. (+) Teeth Intact, Dental Advisory Given   Pulmonary neg pulmonary ROS,    Pulmonary exam normal breath sounds clear to auscultation       Cardiovascular negative cardio ROS   Rhythm:Regular Rate:Normal     Neuro/Psych negative neurological ROS  negative psych ROS   GI/Hepatic negative GI ROS, Neg liver ROS,   Endo/Other  negative endocrine ROS  Renal/GU negative Renal ROS  negative genitourinary   Musculoskeletal   Abdominal   Peds  Hematology negative hematology ROS (+)   Anesthesia Other Findings   Reproductive/Obstetrics negative OB ROS                            Anesthesia Physical Anesthesia Plan  ASA: II and emergent  Anesthesia Plan: General   Post-op Pain Management:    Induction: Intravenous, Rapid sequence and Cricoid pressure planned  PONV Risk Score and Plan: 3 and Midazolam, Ondansetron and Treatment may vary due to age or medical condition  Airway Management Planned: Oral ETT  Additional Equipment:   Intra-op Plan:   Post-operative Plan: Extubation in OR and Possible Post-op intubation/ventilation  Informed Consent: I have reviewed the patients History and Physical, chart, labs and discussed the procedure including the risks, benefits and alternatives for the proposed anesthesia with the patient or authorized representative who has indicated his/her understanding and acceptance.     Dental advisory given  Plan Discussed with: CRNA  Anesthesia Plan Comments:        Anesthesia Quick Evaluation

## 2020-05-26 NOTE — ED Notes (Signed)
Pt back from CT, bleeding still controlled at this time.

## 2020-05-26 NOTE — ED Notes (Signed)
Patient received 2L NS bolus verbal order per Delo, EDP.
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
## 2020-05-27 ENCOUNTER — Inpatient Hospital Stay (HOSPITAL_COMMUNITY): Payer: No Typology Code available for payment source

## 2020-05-27 ENCOUNTER — Encounter (HOSPITAL_COMMUNITY): Payer: Self-pay | Admitting: Radiology

## 2020-05-27 LAB — TYPE AND SCREEN
ABO/RH(D): O POS
Antibody Screen: NEGATIVE
Unit division: 0
Unit division: 0
Unit division: 0

## 2020-05-27 LAB — BPAM FFP
Blood Product Expiration Date: 202108012359
Blood Product Expiration Date: 202108012359
ISSUE DATE / TIME: 202107292349
ISSUE DATE / TIME: 202107292349
Unit Type and Rh: 6200
Unit Type and Rh: 6200

## 2020-05-27 LAB — CBC
HCT: 29.1 % — ABNORMAL LOW (ref 39.0–52.0)
HCT: 24 % — ABNORMAL LOW (ref 39.0–52.0)
HCT: 20.8 % — ABNORMAL LOW (ref 39.0–52.0)
Hemoglobin: 9.3 g/dL — ABNORMAL LOW (ref 13.0–17.0)
Hemoglobin: 7.9 g/dL — ABNORMAL LOW (ref 13.0–17.0)
Hemoglobin: 7 g/dL — ABNORMAL LOW (ref 13.0–17.0)
MCH: 30.2 pg (ref 26.0–34.0)
MCH: 30 pg (ref 26.0–34.0)
MCH: 30.2 pg (ref 26.0–34.0)
MCHC: 32 g/dL (ref 30.0–36.0)
MCHC: 32.9 g/dL (ref 30.0–36.0)
MCHC: 33.7 g/dL (ref 30.0–36.0)
MCV: 94.5 fL (ref 80.0–100.0)
MCV: 91.3 fL (ref 80.0–100.0)
MCV: 89.7 fL (ref 80.0–100.0)
Platelets: 55 10*3/uL — ABNORMAL LOW (ref 150–400)
Platelets: 50 10*3/uL — ABNORMAL LOW (ref 150–400)
Platelets: 43 10*3/uL — ABNORMAL LOW (ref 150–400)
RBC: 3.08 MIL/uL — ABNORMAL LOW (ref 4.22–5.81)
RBC: 2.63 MIL/uL — ABNORMAL LOW (ref 4.22–5.81)
RBC: 2.32 MIL/uL — ABNORMAL LOW (ref 4.22–5.81)
RDW: 14.9 % (ref 11.5–15.5)
RDW: 15.1 % (ref 11.5–15.5)
RDW: 15 % (ref 11.5–15.5)
WBC: 19.9 10*3/uL — ABNORMAL HIGH (ref 4.0–10.5)
WBC: 14.9 10*3/uL — ABNORMAL HIGH (ref 4.0–10.5)
WBC: 13.4 10*3/uL — ABNORMAL HIGH (ref 4.0–10.5)
nRBC: 0 % (ref 0.0–0.2)
nRBC: 0 % (ref 0.0–0.2)
nRBC: 0 % (ref 0.0–0.2)

## 2020-05-27 LAB — BASIC METABOLIC PANEL
Anion gap: 7 (ref 5–15)
Anion gap: 7 (ref 5–15)
BUN: 15 mg/dL (ref 6–20)
BUN: 15 mg/dL (ref 6–20)
CO2: 25 mmol/L (ref 22–32)
CO2: 28 mmol/L (ref 22–32)
Calcium: 7.4 mg/dL — ABNORMAL LOW (ref 8.9–10.3)
Calcium: 8 mg/dL — ABNORMAL LOW (ref 8.9–10.3)
Chloride: 109 mmol/L (ref 98–111)
Chloride: 105 mmol/L (ref 98–111)
Creatinine, Ser: 1.72 mg/dL — ABNORMAL HIGH (ref 0.61–1.24)
Creatinine, Ser: 1.54 mg/dL — ABNORMAL HIGH (ref 0.61–1.24)
GFR calc Af Amer: 58 mL/min — ABNORMAL LOW (ref >60)
GFR calc Af Amer: >60 mL/min (ref >60)
GFR calc non Af Amer: 50 mL/min — ABNORMAL LOW (ref >60)
GFR calc non Af Amer: 57 mL/min — ABNORMAL LOW (ref >60)
Glucose, Bld: 99 mg/dL (ref 70–99)
Glucose, Bld: 118 mg/dL — ABNORMAL HIGH (ref 70–99)
Potassium: 6.8 mmol/L (ref 3.5–5.1)
Potassium: 4.2 mmol/L (ref 3.5–5.1)
Sodium: 141 mmol/L (ref 135–145)
Sodium: 140 mmol/L (ref 135–145)

## 2020-05-27 LAB — BPAM RBC
Blood Product Expiration Date: 202108282359
Blood Product Expiration Date: 202108282359
Blood Product Expiration Date: 202108302359
ISSUE DATE / TIME: 202107291906
ISSUE DATE / TIME: 202107291906
ISSUE DATE / TIME: 202107291906
Unit Type and Rh: 9500
Unit Type and Rh: 9500
Unit Type and Rh: 9500

## 2020-05-27 LAB — TRIGLYCERIDES: Triglycerides: 110 mg/dL (ref <150)

## 2020-05-27 LAB — POCT I-STAT 7, (LYTES, BLD GAS, ICA,H+H)
Acid-base deficit: 11 mmol/L — ABNORMAL HIGH (ref 0.0–2.0)
Acid-base deficit: 5 mmol/L — ABNORMAL HIGH (ref 0.0–2.0)
Bicarbonate: 16.8 mmol/L — ABNORMAL LOW (ref 20.0–28.0)
Bicarbonate: 21.4 mmol/L (ref 20.0–28.0)
Calcium, Ion: 1.07 mmol/L — ABNORMAL LOW (ref 1.15–1.40)
Calcium, Ion: 1.09 mmol/L — ABNORMAL LOW (ref 1.15–1.40)
HCT: 27 % — ABNORMAL LOW (ref 39.0–52.0)
HCT: 32 % — ABNORMAL LOW (ref 39.0–52.0)
Hemoglobin: 10.9 g/dL — ABNORMAL LOW (ref 13.0–17.0)
Hemoglobin: 9.2 g/dL — ABNORMAL LOW (ref 13.0–17.0)
O2 Saturation: 99 %
O2 Saturation: 99 %
Patient temperature: 98.4
Patient temperature: 98.6
Potassium: 5.4 mmol/L — ABNORMAL HIGH (ref 3.5–5.1)
Potassium: 6.8 mmol/L (ref 3.5–5.1)
Sodium: 140 mmol/L (ref 135–145)
Sodium: 142 mmol/L (ref 135–145)
TCO2: 18 mmol/L — ABNORMAL LOW (ref 22–32)
TCO2: 23 mmol/L (ref 22–32)
pCO2 arterial: 43.1 mmHg (ref 32.0–48.0)
pCO2 arterial: 44.3 mmHg (ref 32.0–48.0)
pH, Arterial: 7.2 — ABNORMAL LOW (ref 7.350–7.450)
pH, Arterial: 7.292 — ABNORMAL LOW (ref 7.350–7.450)
pO2, Arterial: 155 mmHg — ABNORMAL HIGH (ref 83.0–108.0)
pO2, Arterial: 177 mmHg — ABNORMAL HIGH (ref 83.0–108.0)

## 2020-05-27 LAB — PREPARE FRESH FROZEN PLASMA
Unit division: 0
Unit division: 0

## 2020-05-27 LAB — PROTIME-INR
INR: 1.4 — ABNORMAL HIGH (ref 0.8–1.2)
Prothrombin Time: 16.2 seconds — ABNORMAL HIGH (ref 11.4–15.2)

## 2020-05-27 LAB — GLOBAL TEG PANEL
CFF Max Amplitude: 9.5 mm — ABNORMAL LOW (ref 15–32)
CK with Heparinase (R): 4.6 min (ref 4.3–8.3)
Citrated Functional Fibrinogen: 173.4 mg/dL — ABNORMAL LOW (ref 278–581)
Citrated Kaolin (K): 3 min — ABNORMAL HIGH (ref 0.8–2.1)
Citrated Kaolin (MA): 46.2 mm — ABNORMAL LOW (ref 52–69)
Citrated Kaolin (R): 4.9 min (ref 4.6–9.1)
Citrated Kaolin Angle: 63.3 deg (ref 63–78)
Citrated Rapid TEG (MA): 41.8 mm — ABNORMAL LOW (ref 52–70)

## 2020-05-27 LAB — PHOSPHORUS: Phosphorus: 3.5 mg/dL (ref 2.5–4.6)

## 2020-05-27 LAB — MRSA PCR SCREENING: MRSA by PCR: NEGATIVE

## 2020-05-27 LAB — MAGNESIUM: Magnesium: 1.8 mg/dL (ref 1.7–2.4)

## 2020-05-27 LAB — HIV ANTIBODY (ROUTINE TESTING W REFLEX): HIV Screen 4th Generation wRfx: NONREACTIVE

## 2020-05-27 MED ORDER — ALBUMIN HUMAN 5 % IV SOLN
12.5000 g | Freq: Once | INTRAVENOUS | Status: AC
  Administered 2020-05-27: 12.5 g via INTRAVENOUS
  Filled 2020-05-27: qty 250

## 2020-05-27 MED ORDER — CHLORHEXIDINE GLUCONATE CLOTH 2 % EX PADS: 6.0000 | MEDICATED_PAD | Freq: Every day | CUTANEOUS | Status: DC

## 2020-05-27 MED ORDER — CALCIUM GLUCONATE-NACL 1-0.675 GM/50ML-% IV SOLN
1.0000 g | Freq: Once | INTRAVENOUS | Status: AC
  Administered 2020-05-27: 1000 mg via INTRAVENOUS
  Filled 2020-05-27: qty 50

## 2020-05-27 MED ORDER — CHLORHEXIDINE GLUCONATE CLOTH 2 % EX PADS
6.0000 | MEDICATED_PAD | Freq: Every day | CUTANEOUS | Status: DC
  Administered 2020-05-27 – 2020-06-03 (×6): 6 via TOPICAL

## 2020-05-27 MED ORDER — DEXTROSE 50 % IV SOLN
25.0000 mL | Freq: Once | INTRAVENOUS | Status: AC
  Administered 2020-05-27: 25 mL via INTRAVENOUS
  Filled 2020-05-27: qty 50

## 2020-05-27 MED ORDER — DOCUSATE SODIUM 100 MG PO CAPS
100.0000 mg | ORAL_CAPSULE | Freq: Two times a day (BID) | ORAL | Status: DC
  Administered 2020-05-27 – 2020-05-29 (×5): 100 mg via ORAL
  Filled 2020-05-27 (×5): qty 1

## 2020-05-27 MED ORDER — FUROSEMIDE 10 MG/ML IJ SOLN
40.0000 mg | Freq: Once | INTRAMUSCULAR | Status: AC
  Administered 2020-05-27: 40 mg via INTRAVENOUS
  Filled 2020-05-27: qty 4

## 2020-05-27 MED ORDER — MORPHINE SULFATE (PF) 4 MG/ML IV SOLN
4.0000 mg | INTRAVENOUS | Status: DC | PRN
  Administered 2020-05-27 – 2020-05-28 (×8): 4 mg via INTRAVENOUS
  Filled 2020-05-27 (×8): qty 1

## 2020-05-27 MED ORDER — POLYETHYLENE GLYCOL 3350 17 G PO PACK: 17.0000 g | PACK | Freq: Every day | ORAL | Status: DC

## 2020-05-27 MED ORDER — MAGNESIUM SULFATE 2 GM/50ML IV SOLN
2.0000 g | Freq: Once | INTRAVENOUS | Status: AC
  Administered 2020-05-27: 2 g via INTRAVENOUS
  Filled 2020-05-27: qty 50

## 2020-05-27 MED ORDER — OXYCODONE HCL 5 MG/5ML PO SOLN: 5.0000 mg | ORAL | Status: DC | PRN

## 2020-05-27 MED ORDER — ACETAMINOPHEN 500 MG PO TABS
1000.0000 mg | ORAL_TABLET | Freq: Four times a day (QID) | ORAL | Status: DC
  Administered 2020-05-27 – 2020-05-28 (×4): 1000 mg via ORAL
  Filled 2020-05-27 (×4): qty 2

## 2020-05-27 MED ORDER — LACTATED RINGERS IV BOLUS
1000.0000 mL | Freq: Once | INTRAVENOUS | Status: AC
  Administered 2020-05-27: 1000 mL via INTRAVENOUS

## 2020-05-27 MED ORDER — OXYCODONE HCL 5 MG PO TABS
5.0000 mg | ORAL_TABLET | ORAL | Status: DC | PRN
  Administered 2020-05-27 – 2020-05-28 (×6): 10 mg via ORAL
  Filled 2020-05-27 (×6): qty 2

## 2020-05-27 MED ORDER — METHOCARBAMOL 500 MG PO TABS
1000.0000 mg | ORAL_TABLET | Freq: Three times a day (TID) | ORAL | Status: DC
  Administered 2020-05-27 – 2020-05-30 (×9): 1000 mg via ORAL
  Filled 2020-05-27 (×9): qty 2

## 2020-05-27 MED ORDER — INSULIN ASPART 100 UNIT/ML IV SOLN
10.0000 [IU] | Freq: Once | INTRAVENOUS | Status: AC
  Administered 2020-05-27: 10 [IU] via INTRAVENOUS
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
## 2020-05-27 NOTE — Procedures (Signed)
Extubation Procedure Note  Patient Details:   Name: Brian Duran DOB: 11/17/1985 MRN: [id number]   Airway Documentation:    Vent end date: 05/27/20 Vent end time: 0825   Evaluation  O2 sats: stable throughout Complications: No apparent complications Patient did tolerate procedure well. Bilateral Breath Sounds: Diminished, Rhonchi   Yes   [name], [name]   Derinda Late 05/27/2020, 8:32 AM

## 2020-05-27 NOTE — Procedures (Signed)
Insertion of Chest Tube Procedure Note  Brian Duran  [id number]  01-02-1986  Date:05/27/20  Time:1:18 AM    Provider Performing: Liz Malady   Procedure: Chest Tube Insertion [phone])  Indication(s) Pneumothorax  Consent Unable to obtain consent due to emergent nature of procedure.  Anesthesia sedation   Time Out Verified patient identification, verified procedure, site/side was marked, verified correct patient position, special equipment/implants available, medications/allergies/relevant history reviewed, required imaging and test results available.   Sterile Technique Maximal sterile technique including full sterile barrier drape, hand hygiene, sterile gown, sterile gloves, mask, hair covering, sterile ultrasound probe cover (if used).   Procedure Description Ultrasound not used to identify appropriate pleural anatomy for placement and overlying skin marked. Area of placement cleaned and draped in sterile fashion.  A 14 French pigtail pleural catheter was placed into the right pleural space using Seldinger technique. Appropriate return of air was obtained.  The tube was connected to atrium and placed on -20 cm H2O wall suction. I also pulled back first tube and re-sutured it.    Complications/Tolerance none Chest X-ray is ordered to verify placement.   EBL 700 out of new tube  Specimen(s) none
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
## 2020-05-27 NOTE — Progress Notes (Signed)
PT Cancellation Note  Patient Details Name: Brian Duran MRN: [id number] DOB: [birth year]   Cancelled Treatment:    Reason Eval/Treat Not Completed: Medical issues which prohibited therapy. Pt with femoral line at this time, PT will hold until line is removed due to risk of bleeding.  Arlyss Gandy, PT, DPT Acute Rehabilitation Pager: [phone]  Arlyss Gandy 05/27/2020, 3:42 PM

## 2020-05-28 LAB — COMPREHENSIVE METABOLIC PANEL
ALT: 1285 U/L — ABNORMAL HIGH (ref 0–44)
AST: 963 U/L — ABNORMAL HIGH (ref 15–41)
Albumin: 2.9 g/dL — ABNORMAL LOW (ref 3.5–5.0)
Alkaline Phosphatase: 40 U/L (ref 38–126)
Anion gap: 5 (ref 5–15)
BUN: 14 mg/dL (ref 6–20)
CO2: 28 mmol/L (ref 22–32)
Calcium: 8 mg/dL — ABNORMAL LOW (ref 8.9–10.3)
Chloride: 105 mmol/L (ref 98–111)
Creatinine, Ser: 1.29 mg/dL — ABNORMAL HIGH (ref 0.61–1.24)
GFR calc Af Amer: >60 mL/min (ref >60)
GFR calc non Af Amer: >60 mL/min (ref >60)
Glucose, Bld: 135 mg/dL — ABNORMAL HIGH (ref 70–99)
Potassium: 4.5 mmol/L (ref 3.5–5.1)
Sodium: 138 mmol/L (ref 135–145)
Total Bilirubin: 1.2 mg/dL (ref 0.3–1.2)
Total Protein: 4.7 g/dL — ABNORMAL LOW (ref 6.5–8.1)

## 2020-05-28 LAB — CBC
HCT: 20.2 % — ABNORMAL LOW (ref 39.0–52.0)
HCT: 23.1 % — ABNORMAL LOW (ref 39.0–52.0)
Hemoglobin: 6.7 g/dL — CL (ref 13.0–17.0)
Hemoglobin: 7.6 g/dL — ABNORMAL LOW (ref 13.0–17.0)
MCH: 29.9 pg (ref 26.0–34.0)
MCH: 29.7 pg (ref 26.0–34.0)
MCHC: 33.2 g/dL (ref 30.0–36.0)
MCHC: 32.9 g/dL (ref 30.0–36.0)
MCV: 90.2 fL (ref 80.0–100.0)
MCV: 90.2 fL (ref 80.0–100.0)
Platelets: 45 10*3/uL — ABNORMAL LOW (ref 150–400)
Platelets: 65 10*3/uL — ABNORMAL LOW (ref 150–400)
RBC: 2.24 MIL/uL — ABNORMAL LOW (ref 4.22–5.81)
RBC: 2.56 MIL/uL — ABNORMAL LOW (ref 4.22–5.81)
RDW: 15.1 % (ref 11.5–15.5)
RDW: 14.5 % (ref 11.5–15.5)
WBC: 13.5 10*3/uL — ABNORMAL HIGH (ref 4.0–10.5)
WBC: 15.3 10*3/uL — ABNORMAL HIGH (ref 4.0–10.5)
nRBC: 0 % (ref 0.0–0.2)
nRBC: 0 % (ref 0.0–0.2)

## 2020-05-28 LAB — CALCIUM, IONIZED: Calcium, Ionized, Serum: 4.1 mg/dL — ABNORMAL LOW (ref 4.5–5.6)

## 2020-05-28 MED ORDER — SODIUM CHLORIDE 0.9 % IV SOLN: INTRAVENOUS | Status: DC

## 2020-05-28 MED ORDER — ACETAMINOPHEN 325 MG PO TABS
650.0000 mg | ORAL_TABLET | Freq: Four times a day (QID) | ORAL | Status: DC
  Administered 2020-05-28: 650 mg via ORAL
  Filled 2020-05-28: qty 2

## 2020-05-28 MED ORDER — SODIUM CHLORIDE 0.9% IV SOLUTION: Freq: Once | INTRAVENOUS | Status: DC

## 2020-05-28 MED ORDER — ACETAMINOPHEN 325 MG PO TABS: 650.0000 mg | ORAL_TABLET | Freq: Three times a day (TID) | ORAL | Status: DC

## 2020-05-28 MED ORDER — HEPARIN SODIUM (PORCINE) 5000 UNIT/ML IJ SOLN
5000.0000 [IU] | Freq: Three times a day (TID) | INTRAMUSCULAR | Status: DC
  Administered 2020-05-28 – 2020-05-29 (×3): 5000 [IU] via SUBCUTANEOUS
  Filled 2020-05-28 (×3): qty 1

## 2020-05-28 MED ORDER — MORPHINE SULFATE (PF) 4 MG/ML IV SOLN
4.0000 mg | INTRAVENOUS | Status: DC | PRN
  Administered 2020-05-28 – 2020-05-29 (×5): 4 mg via INTRAVENOUS
  Filled 2020-05-28 (×5): qty 1

## 2020-05-28 MED ORDER — OXYCODONE HCL 5 MG PO TABS
10.0000 mg | ORAL_TABLET | ORAL | Status: DC | PRN
  Administered 2020-05-28 – 2020-05-29 (×3): 15 mg via ORAL
  Filled 2020-05-28 (×3): qty 3

## 2020-05-28 NOTE — Progress Notes (Signed)
Pt arrived to 4NP02 from [id number]. Report received from [employer], [place]. Pt drowsy, but orientedx4. Vital signs taken and are stable, with an elevated bp. Temperature has resolved since Tylenol given by previous RN. Both chest tubes set up to -20 suction, as ordered.   Robina Ade, RN

## 2020-05-28 NOTE — Progress Notes (Signed)
Pt complaining of 8/10 back and side pain despite IV morphine and PO oxy 10 mg. MD paged to make aware.   Delories Heinz, RN
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
## 2020-05-28 NOTE — Progress Notes (Signed)
MD verbal order for blood cultures and urine culture. Pt temp 100.4 despite tylenol.  Delories Heinz, RN
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
## 2020-05-29 ENCOUNTER — Inpatient Hospital Stay (HOSPITAL_COMMUNITY): Payer: No Typology Code available for payment source

## 2020-05-29 LAB — TYPE AND SCREEN
ABO/RH(D): O POS
Antibody Screen: NEGATIVE
Unit division: 0
Unit division: 0
Unit division: 0

## 2020-05-29 LAB — BASIC METABOLIC PANEL
Anion gap: 9 (ref 5–15)
BUN: 18 mg/dL (ref 6–20)
CO2: 22 mmol/L (ref 22–32)
Calcium: 7.9 mg/dL — ABNORMAL LOW (ref 8.9–10.3)
Chloride: 103 mmol/L (ref 98–111)
Creatinine, Ser: 1.14 mg/dL (ref 0.61–1.24)
GFR calc Af Amer: >60 mL/min (ref >60)
GFR calc non Af Amer: >60 mL/min (ref >60)
Glucose, Bld: 100 mg/dL — ABNORMAL HIGH (ref 70–99)
Potassium: 4.6 mmol/L (ref 3.5–5.1)
Sodium: 134 mmol/L — ABNORMAL LOW (ref 135–145)

## 2020-05-29 LAB — HEPATIC FUNCTION PANEL
ALT: 937 U/L — ABNORMAL HIGH (ref 0–44)
AST: 442 U/L — ABNORMAL HIGH (ref 15–41)
Albumin: 2.9 g/dL — ABNORMAL LOW (ref 3.5–5.0)
Alkaline Phosphatase: 53 U/L (ref 38–126)
Bilirubin, Direct: 0.5 mg/dL — ABNORMAL HIGH (ref 0.0–0.2)
Indirect Bilirubin: 1 mg/dL — ABNORMAL HIGH (ref 0.3–0.9)
Total Bilirubin: 1.5 mg/dL — ABNORMAL HIGH (ref 0.3–1.2)
Total Protein: 5 g/dL — ABNORMAL LOW (ref 6.5–8.1)

## 2020-05-29 LAB — CBC
HCT: 21.3 % — ABNORMAL LOW (ref 39.0–52.0)
Hemoglobin: 7.2 g/dL — ABNORMAL LOW (ref 13.0–17.0)
MCH: 30 pg (ref 26.0–34.0)
MCHC: 33.8 g/dL (ref 30.0–36.0)
MCV: 88.8 fL (ref 80.0–100.0)
Platelets: 68 10*3/uL — ABNORMAL LOW (ref 150–400)
RBC: 2.4 MIL/uL — ABNORMAL LOW (ref 4.22–5.81)
RDW: 14 % (ref 11.5–15.5)
WBC: 11.8 10*3/uL — ABNORMAL HIGH (ref 4.0–10.5)
nRBC: 0 % (ref 0.0–0.2)

## 2020-05-29 LAB — BPAM PLATELET PHERESIS
Blood Product Expiration Date: 202107312359
ISSUE DATE / TIME: 202107310350
Unit Type and Rh: 9500

## 2020-05-29 LAB — URINE CULTURE: Culture: NO GROWTH

## 2020-05-29 LAB — BPAM RBC
Blood Product Expiration Date: 202108052359
Blood Product Expiration Date: 202108292359
Blood Product Expiration Date: 202108292359
ISSUE DATE / TIME: 202107292149
ISSUE DATE / TIME: 202107292149
ISSUE DATE / TIME: 202107310350
Unit Type and Rh: 5100
Unit Type and Rh: 5100
Unit Type and Rh: 9500

## 2020-05-29 LAB — PREPARE PLATELET PHERESIS: Unit division: 0

## 2020-05-29 MED ORDER — ACETAMINOPHEN 500 MG PO TABS
1000.0000 mg | ORAL_TABLET | Freq: Once | ORAL | Status: AC
  Administered 2020-05-29: 1000 mg via ORAL
  Filled 2020-05-29: qty 2

## 2020-05-29 MED ORDER — POLYETHYLENE GLYCOL 3350 17 G PO PACK
17.0000 g | PACK | Freq: Every day | ORAL | Status: DC
  Administered 2020-05-29: 17 g via ORAL
  Filled 2020-05-29: qty 1

## 2020-05-29 MED ORDER — BISACODYL 10 MG RE SUPP: 10.0000 mg | Freq: Every day | RECTAL | Status: DC | PRN

## 2020-05-29 MED ORDER — GABAPENTIN 100 MG PO CAPS
200.0000 mg | ORAL_CAPSULE | Freq: Three times a day (TID) | ORAL | Status: DC
  Administered 2020-05-29 (×2): 200 mg via ORAL
  Filled 2020-05-29 (×2): qty 2

## 2020-05-29 MED ORDER — MORPHINE SULFATE (PF) 2 MG/ML IV SOLN
2.0000 mg | INTRAVENOUS | Status: DC | PRN
  Administered 2020-05-29: 2 mg via INTRAVENOUS
  Filled 2020-05-29: qty 1

## 2020-05-29 MED ORDER — TRAMADOL HCL 50 MG PO TABS
50.0000 mg | ORAL_TABLET | Freq: Four times a day (QID) | ORAL | Status: DC
  Administered 2020-05-29 – 2020-05-30 (×4): 50 mg via ORAL
  Filled 2020-05-29 (×4): qty 1

## 2020-05-29 NOTE — Progress Notes (Signed)
   05/28/20 2356  Assess: MEWS Score  Temp 100 F (37.8 C)  BP (!) 166/75  Pulse Rate 104  ECG Heart Rate (!) 115  Resp 20  SpO2 92 %  Assess: MEWS Score  MEWS Temp 0  MEWS Systolic 0  MEWS Pulse 2  MEWS RR 0  MEWS LOC 0  MEWS Score 2  MEWS Score Color Yellow  Assess: if the MEWS score is Yellow or Red  Were vital signs taken at a resting state? Yes  Focused Assessment No change from prior assessment  Treat  MEWS Interventions Administered scheduled meds/treatments  Notify: Charge Nurse/RN  Date Charge Nurse/RN Notified 05/29/20  Time Charge Nurse/RN Notified 0015  Notify: Provider  Provider Name/Title Dr Dwain Sarna  Date Provider Notified 05/29/20  Time Provider Notified 0030  Notification Type Page  Notification Reason Other (Comment) (Low grade temp)
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
## 2020-05-29 NOTE — Progress Notes (Signed)
Order obtained for NG tube. 16 french NG tube inserted. Positive air bolus auscultated. 800 cc gastric contents obtained immediately. Pt tolerated well. Will obtain abdominal x-ray.

## 2020-05-30 ENCOUNTER — Inpatient Hospital Stay (HOSPITAL_COMMUNITY): Payer: No Typology Code available for payment source

## 2020-05-30 LAB — BASIC METABOLIC PANEL
Anion gap: 9 (ref 5–15)
BUN: 13 mg/dL (ref 6–20)
CO2: 27 mmol/L (ref 22–32)
Calcium: 7.9 mg/dL — ABNORMAL LOW (ref 8.9–10.3)
Chloride: 104 mmol/L (ref 98–111)
Creatinine, Ser: 1.18 mg/dL (ref 0.61–1.24)
GFR calc Af Amer: >60 mL/min (ref >60)
GFR calc non Af Amer: >60 mL/min (ref >60)
Glucose, Bld: 124 mg/dL — ABNORMAL HIGH (ref 70–99)
Potassium: 3.7 mmol/L (ref 3.5–5.1)
Sodium: 140 mmol/L (ref 135–145)

## 2020-05-30 LAB — CBC
HCT: 20.5 % — ABNORMAL LOW (ref 39.0–52.0)
Hemoglobin: 6.9 g/dL — CL (ref 13.0–17.0)
MCH: 30.3 pg (ref 26.0–34.0)
MCHC: 33.7 g/dL (ref 30.0–36.0)
MCV: 89.9 fL (ref 80.0–100.0)
Platelets: 84 10*3/uL — ABNORMAL LOW (ref 150–400)
RBC: 2.28 MIL/uL — ABNORMAL LOW (ref 4.22–5.81)
RDW: 13.9 % (ref 11.5–15.5)
WBC: 12.4 10*3/uL — ABNORMAL HIGH (ref 4.0–10.5)
nRBC: 0 % (ref 0.0–0.2)

## 2020-05-30 LAB — HEMOGLOBIN AND HEMATOCRIT, BLOOD
HCT: 24.3 % — ABNORMAL LOW (ref 39.0–52.0)
Hemoglobin: 8.1 g/dL — ABNORMAL LOW (ref 13.0–17.0)

## 2020-05-30 LAB — PREPARE RBC (CROSSMATCH)

## 2020-05-30 MED ORDER — MORPHINE SULFATE (PF) 2 MG/ML IV SOLN: 1.0000 mg | INTRAVENOUS | Status: DC | PRN

## 2020-05-30 MED ORDER — SODIUM CHLORIDE 0.9% IV SOLUTION: Freq: Once | INTRAVENOUS | Status: AC

## 2020-05-30 MED ORDER — MORPHINE SULFATE (PF) 2 MG/ML IV SOLN
1.0000 mg | INTRAVENOUS | Status: DC | PRN
  Administered 2020-05-30 – 2020-06-01 (×9): 4 mg via INTRAVENOUS
  Administered 2020-06-01: 2 mg via INTRAVENOUS
  Administered 2020-06-01: 4 mg via INTRAVENOUS
  Administered 2020-06-01 (×2): 2 mg via INTRAVENOUS
  Administered 2020-06-02 – 2020-06-03 (×10): 4 mg via INTRAVENOUS
  Filled 2020-05-30 (×2): qty 2
  Filled 2020-05-30: qty 1
  Filled 2020-05-30: qty 2
  Filled 2020-05-30: qty 1
  Filled 2020-05-30: qty 2
  Filled 2020-05-30: qty 1
  Filled 2020-05-30 (×3): qty 2
  Filled 2020-05-30 (×2): qty 1
  Filled 2020-05-30 (×12): qty 2

## 2020-05-30 MED ORDER — BISACODYL 10 MG RE SUPP: 10.0000 mg | Freq: Once | RECTAL | Status: DC

## 2020-05-30 MED ORDER — METHOCARBAMOL 1000 MG/10ML IJ SOLN
1000.0000 mg | Freq: Three times a day (TID) | INTRAVENOUS | Status: DC
  Administered 2020-05-30 (×2): 1000 mg via INTRAVENOUS
  Filled 2020-05-30 (×7): qty 10

## 2020-05-30 MED ORDER — ACETAMINOPHEN 10 MG/ML IV SOLN
1000.0000 mg | Freq: Four times a day (QID) | INTRAVENOUS | Status: AC
  Administered 2020-05-30 – 2020-05-31 (×4): 1000 mg via INTRAVENOUS
  Filled 2020-05-30 (×4): qty 100
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
## 2020-05-30 NOTE — TOC CAGE-AID Note (Signed)
Transition of Care [facility]) - CAGE-AID Screening   Patient Details  Name: Brian Duran MRN: [id number] Date of Birth: August 13, 1986  Transition of Care [facility]) CM/SW Contact:    Emeterio Reeve, [place] Phone Number: 05/30/2020, 1:04 PM   Clinical Narrative:  CSW met with pt at bedside. CSW introduced self and explained her role at the hospital.  Pt states that he was once a daily alcohol user but has cut back significantly. Pt states he plans on stopping. Pt reports occasional marijuana use.  Pt denied resources and Scientist, clinical (histocompatibility and immunogenetics). Pt states he knows what he needs to do to stop he just needs to do it.    CAGE-AID Screening:    Have You Ever Felt You Ought to Cut Down on Your Drinking or Drug Use?: Yes Have People Annoyed You By Critizing Your Drinking Or Drug Use?: No Have You Felt Bad Or Guilty About Your Drinking Or Drug Use?: No      Substance Abuse Education Offered: Yes  Substance abuse interventions: Patient Counseling   Blima Ledger, [place] Social Worker [phone]

## 2020-05-30 NOTE — Progress Notes (Signed)
Pt received 1 unit RBC today without incident. Chest tubes to water seal per orders and intact. NG tube intact, output is light green/clear. Continues to complain of abdominal pain, abd remains taut distended,  generalized pain, pain at CT sites, pain in back and right shoulder. Medicated and notified PA Brooke for update on status/pain/distention. Passing some flatus, bowel sounds hypoactive. Suppository ordered. Gabriel Cirri RN

## 2020-05-30 NOTE — Progress Notes (Signed)
2200: Upon initial assessment, pts abdomen was very taut and distended. Dayshift RN said it is more distended now than earlier. Pt complaining of unrelieved pain despite medications. Pt tachypnic, tachycardic, and diaphoretic. On call provider notified and new order for x-ray obtained.   0000: Pt. continues to be in pain. Low grade fever also noted with red MEWS. Fan and ice pack provided to patient. On call provider notified and new order for NG tube obtained. NG tube inserted. Will continue to monitor. Patient reports relief with NG tube placement.

## 2020-05-31 ENCOUNTER — Encounter (HOSPITAL_COMMUNITY): Payer: Self-pay | Admitting: General Surgery

## 2020-05-31 ENCOUNTER — Inpatient Hospital Stay (HOSPITAL_COMMUNITY): Payer: No Typology Code available for payment source

## 2020-05-31 ENCOUNTER — Inpatient Hospital Stay (HOSPITAL_COMMUNITY): Payer: No Typology Code available for payment source | Admitting: Certified Registered Nurse Anesthetist

## 2020-05-31 ENCOUNTER — Encounter (HOSPITAL_COMMUNITY): Admission: EM | Disposition: A | Discharge status: Home / Self Care | Payer: Self-pay | Source: Home / Self Care

## 2020-05-31 DIAGNOSIS — J918 Pleural effusion in other conditions classified elsewhere: Secondary | ICD-10-CM

## 2020-05-31 DIAGNOSIS — W3400XA Accidental discharge from unspecified firearms or gun, initial encounter: Secondary | ICD-10-CM

## 2020-05-31 HISTORY — PX: CHEST TUBE INSERTION: SHX231

## 2020-05-31 HISTORY — PX: VIDEO ASSISTED THORACOSCOPY: SHX5073

## 2020-05-31 LAB — COMPREHENSIVE METABOLIC PANEL
ALT: 385 U/L — ABNORMAL HIGH (ref 0–44)
AST: 132 U/L — ABNORMAL HIGH (ref 15–41)
Albumin: 2.3 g/dL — ABNORMAL LOW (ref 3.5–5.0)
Alkaline Phosphatase: 59 U/L (ref 38–126)
Anion gap: 9 (ref 5–15)
BUN: 14 mg/dL (ref 6–20)
CO2: 24 mmol/L (ref 22–32)
Calcium: 7.9 mg/dL — ABNORMAL LOW (ref 8.9–10.3)
Chloride: 108 mmol/L (ref 98–111)
Creatinine, Ser: 1.08 mg/dL (ref 0.61–1.24)
GFR calc Af Amer: >60 mL/min (ref >60)
GFR calc non Af Amer: >60 mL/min (ref >60)
Glucose, Bld: 117 mg/dL — ABNORMAL HIGH (ref 70–99)
Potassium: 3.7 mmol/L (ref 3.5–5.1)
Sodium: 141 mmol/L (ref 135–145)
Total Bilirubin: 2.2 mg/dL — ABNORMAL HIGH (ref 0.3–1.2)
Total Protein: 4.9 g/dL — ABNORMAL LOW (ref 6.5–8.1)

## 2020-05-31 LAB — URINALYSIS, ROUTINE W REFLEX MICROSCOPIC
Bacteria, UA: NONE SEEN
Bilirubin Urine: NEGATIVE
Glucose, UA: NEGATIVE mg/dL
Ketones, ur: 5 mg/dL — AB
Leukocytes,Ua: NEGATIVE
Nitrite: NEGATIVE
Protein, ur: 30 mg/dL — AB
RBC / HPF: >50 RBC/hpf — ABNORMAL HIGH (ref 0–5)
Specific Gravity, Urine: 1.038 — ABNORMAL HIGH (ref 1.005–1.030)
pH: 7 (ref 5.0–8.0)

## 2020-05-31 LAB — CBC
HCT: 23.4 % — ABNORMAL LOW (ref 39.0–52.0)
Hemoglobin: 7.9 g/dL — ABNORMAL LOW (ref 13.0–17.0)
MCH: 30.2 pg (ref 26.0–34.0)
MCHC: 33.8 g/dL (ref 30.0–36.0)
MCV: 89.3 fL (ref 80.0–100.0)
Platelets: 91 10*3/uL — ABNORMAL LOW (ref 150–400)
RBC: 2.62 MIL/uL — ABNORMAL LOW (ref 4.22–5.81)
RDW: 14.6 % (ref 11.5–15.5)
WBC: 14.1 10*3/uL — ABNORMAL HIGH (ref 4.0–10.5)
nRBC: 0.1 % (ref 0.0–0.2)

## 2020-05-31 LAB — TYPE AND SCREEN
ABO/RH(D): O POS
Antibody Screen: NEGATIVE
Unit division: 0

## 2020-05-31 LAB — GLUCOSE, CAPILLARY: Glucose-Capillary: 124 mg/dL — ABNORMAL HIGH (ref 70–99)

## 2020-05-31 LAB — BPAM RBC
Blood Product Expiration Date: 202108052359
ISSUE DATE / TIME: 202108021110
Unit Type and Rh: 9500

## 2020-05-31 LAB — MAGNESIUM: Magnesium: 2.2 mg/dL (ref 1.7–2.4)

## 2020-05-31 SURGERY — VIDEO ASSISTED THORACOSCOPY
Anesthesia: General | Site: Chest | Laterality: Right

## 2020-05-31 MED ORDER — LACTATED RINGERS IV SOLN: INTRAVENOUS | Status: DC

## 2020-05-31 MED ORDER — INSULIN ASPART 100 UNIT/ML ~~LOC~~ SOLN
0.0000 [IU] | Freq: Three times a day (TID) | SUBCUTANEOUS | Status: DC
  Administered 2020-05-31 – 2020-06-05 (×5): 2 [IU] via SUBCUTANEOUS

## 2020-05-31 MED ORDER — ACETAMINOPHEN 325 MG PO TABS: 325.0000 mg | ORAL_TABLET | Freq: Once | ORAL | Status: DC | PRN

## 2020-05-31 MED ORDER — ACETAMINOPHEN 10 MG/ML IV SOLN: 1000.0000 mg | Freq: Once | INTRAVENOUS | Status: DC | PRN

## 2020-05-31 MED ORDER — MIDAZOLAM HCL 2 MG/2ML IJ SOLN
INTRAMUSCULAR | Status: AC
  Filled 2020-05-31: qty 2

## 2020-05-31 MED ORDER — AMISULPRIDE (ANTIEMETIC) 5 MG/2ML IV SOLN: 10.0000 mg | Freq: Once | INTRAVENOUS | Status: DC | PRN

## 2020-05-31 MED ORDER — BUPIVACAINE HCL (PF) 0.5 % IJ SOLN
INTRAMUSCULAR | Status: DC | PRN
Start: 2020-05-31 — End: 2020-05-31
  Administered 2020-05-31: 25 mL

## 2020-05-31 MED ORDER — BUPIVACAINE LIPOSOME 1.3 % IJ SUSP
20.0000 mL | Freq: Once | INTRAMUSCULAR | Status: DC
  Filled 2020-05-31: qty 20

## 2020-05-31 MED ORDER — HYDROMORPHONE HCL 1 MG/ML IJ SOLN
INTRAMUSCULAR | Status: AC
  Administered 2020-05-31: 0.5 mg via INTRAVENOUS
  Filled 2020-05-31: qty 1

## 2020-05-31 MED ORDER — PHENYLEPHRINE HCL-NACL 10-0.9 MG/250ML-% IV SOLN
INTRAVENOUS | Status: AC
  Filled 2020-05-31: qty 250

## 2020-05-31 MED ORDER — ESMOLOL HCL 100 MG/10ML IV SOLN
INTRAVENOUS | Status: DC | PRN
  Administered 2020-05-31: 20 mg via INTRAVENOUS

## 2020-05-31 MED ORDER — ROCURONIUM BROMIDE 10 MG/ML (PF) SYRINGE
PREFILLED_SYRINGE | INTRAVENOUS | Status: DC | PRN
  Administered 2020-05-31: 60 mg via INTRAVENOUS

## 2020-05-31 MED ORDER — 0.9 % SODIUM CHLORIDE (POUR BTL) OPTIME
TOPICAL | Status: DC | PRN
  Administered 2020-05-31: 2000 mL

## 2020-05-31 MED ORDER — ACETAMINOPHEN 10 MG/ML IV SOLN
INTRAVENOUS | Status: DC | PRN
  Administered 2020-05-31: 1000 mg via INTRAVENOUS

## 2020-05-31 MED ORDER — FENTANYL CITRATE (PF) 250 MCG/5ML IJ SOLN
INTRAMUSCULAR | Status: AC
  Filled 2020-05-31: qty 5

## 2020-05-31 MED ORDER — HYDROMORPHONE HCL 1 MG/ML IJ SOLN: 0.2500 mg | INTRAMUSCULAR | Status: DC | PRN

## 2020-05-31 MED ORDER — ACETAMINOPHEN 500 MG PO TABS: 1000.0000 mg | ORAL_TABLET | Freq: Four times a day (QID) | ORAL | Status: DC

## 2020-05-31 MED ORDER — ACETAMINOPHEN 500 MG PO TABS
1000.0000 mg | ORAL_TABLET | Freq: Four times a day (QID) | ORAL | Status: AC
  Administered 2020-06-01 – 2020-06-05 (×16): 1000 mg via ORAL
  Filled 2020-05-31 (×16): qty 2

## 2020-05-31 MED ORDER — PROPOFOL 10 MG/ML IV BOLUS
INTRAVENOUS | Status: AC
  Filled 2020-05-31: qty 20

## 2020-05-31 MED ORDER — ACETAMINOPHEN 160 MG/5ML PO SOLN
1000.0000 mg | Freq: Four times a day (QID) | ORAL | Status: DC
  Administered 2020-05-31: 1000 mg
  Filled 2020-05-31: qty 40.6

## 2020-05-31 MED ORDER — ACETAMINOPHEN 160 MG/5ML PO SOLN: 325.0000 mg | Freq: Once | ORAL | Status: DC | PRN

## 2020-05-31 MED ORDER — IOHEXOL 300 MG/ML  SOLN
100.0000 mL | Freq: Once | INTRAMUSCULAR | Status: AC | PRN
  Administered 2020-05-31: 100 mL via INTRAVENOUS

## 2020-05-31 MED ORDER — FENTANYL CITRATE (PF) 250 MCG/5ML IJ SOLN
INTRAMUSCULAR | Status: DC | PRN
  Administered 2020-05-31: 100 ug via INTRAVENOUS
  Administered 2020-05-31: 150 ug via INTRAVENOUS

## 2020-05-31 MED ORDER — ACETAMINOPHEN 160 MG/5ML PO SOLN
1000.0000 mg | Freq: Four times a day (QID) | ORAL | Status: DC
  Administered 2020-05-31: 1000 mg via ORAL
  Filled 2020-05-31: qty 40.6

## 2020-05-31 MED ORDER — DEXMEDETOMIDINE (PRECEDEX) IN NS 20 MCG/5ML (4 MCG/ML) IV SYRINGE
PREFILLED_SYRINGE | INTRAVENOUS | Status: DC | PRN
  Administered 2020-05-31: 8 ug via INTRAVENOUS
  Administered 2020-05-31: 12 ug via INTRAVENOUS
  Administered 2020-05-31 (×2): 4 ug via INTRAVENOUS

## 2020-05-31 MED ORDER — LACTATED RINGERS IV SOLN: INTRAVENOUS | Status: DC | PRN

## 2020-05-31 MED ORDER — BUPIVACAINE HCL (PF) 0.5 % IJ SOLN
INTRAMUSCULAR | Status: AC
  Filled 2020-05-31: qty 30

## 2020-05-31 MED ORDER — SUGAMMADEX SODIUM 200 MG/2ML IV SOLN
INTRAVENOUS | Status: DC | PRN
Start: 2020-05-31 — End: 2020-05-31
  Administered 2020-05-31: 400 mg via INTRAVENOUS

## 2020-05-31 MED ORDER — SODIUM CHLORIDE 0.9 % IV SOLN
INTRAVENOUS | Status: AC
  Filled 2020-05-31: qty 1.5

## 2020-05-31 MED ORDER — MEPERIDINE HCL 25 MG/ML IJ SOLN: 6.2500 mg | INTRAMUSCULAR | Status: DC | PRN

## 2020-05-31 MED ORDER — SODIUM CHLORIDE 0.9 % IV SOLN: INTRAVENOUS | Status: DC | PRN

## 2020-05-31 MED ORDER — ACETAMINOPHEN 10 MG/ML IV SOLN
INTRAVENOUS | Status: AC
  Filled 2020-05-31: qty 100

## 2020-05-31 MED ORDER — LIDOCAINE HCL (CARDIAC) PF 100 MG/5ML IV SOSY: PREFILLED_SYRINGE | INTRAVENOUS | Status: DC | PRN

## 2020-05-31 MED ORDER — LIDOCAINE 2% (20 MG/ML) 5 ML SYRINGE
INTRAMUSCULAR | Status: DC | PRN
  Administered 2020-05-31: 50 mg via INTRAVENOUS

## 2020-05-31 MED ORDER — DEXAMETHASONE SODIUM PHOSPHATE 4 MG/ML IJ SOLN
INTRAMUSCULAR | Status: DC | PRN
Start: 2020-05-31 — End: 2020-05-31
  Administered 2020-05-31: 4 mg via INTRAVENOUS

## 2020-05-31 MED ORDER — SUCCINYLCHOLINE CHLORIDE 200 MG/10ML IV SOSY
PREFILLED_SYRINGE | INTRAVENOUS | Status: DC | PRN
Start: 2020-05-31 — End: 2020-05-31
  Administered 2020-05-31: 160 mg via INTRAVENOUS

## 2020-05-31 MED ORDER — SODIUM CHLORIDE 0.9 % IV SOLN
INTRAVENOUS | Status: DC | PRN
  Administered 2020-05-31: 1.5 g via INTRAVENOUS

## 2020-05-31 MED ORDER — PROPOFOL 10 MG/ML IV BOLUS
INTRAVENOUS | Status: DC | PRN
  Administered 2020-05-31: 150 mg via INTRAVENOUS
  Administered 2020-05-31: 20 mg via INTRAVENOUS

## 2020-05-31 MED ORDER — MIDAZOLAM HCL 5 MG/5ML IJ SOLN
INTRAMUSCULAR | Status: DC | PRN
  Administered 2020-05-31: 2 mg via INTRAVENOUS

## 2020-05-31 SURGICAL SUPPLY — 97 items
APL PRP STRL LF DISP 70% ISPRP (MISCELLANEOUS) ×1
APPLIER CLIP ROT 10 11.4 M/L (STAPLE)
BLADE CLIPPER SURG (BLADE) IMPLANT
CANISTER SUCT 3000ML PPV (MISCELLANEOUS) ×3 IMPLANT
CATH ROBINSON RED A/P 14FR (CATHETERS) IMPLANT
CATH THORACIC 28FR (CATHETERS) ×3 IMPLANT
CATH THORACIC 28FR RT ANG (CATHETERS) IMPLANT
CATH THORACIC 36FR (CATHETERS) IMPLANT
CATH THORACIC 36FR RT ANG (CATHETERS) IMPLANT
CATH TROCAR 20FR (CATHETERS) IMPLANT
CHLORAPREP W/TINT 26 (MISCELLANEOUS) ×3 IMPLANT
CLIP APPLIE ROT 10 11.4 M/L (STAPLE) IMPLANT
CLIP VESOCCLUDE MED 6/CT (CLIP) IMPLANT
CNTNR URN SCR LID CUP LEK RST (MISCELLANEOUS) ×4 IMPLANT
CONN ST 1/4X3/8  BEN (MISCELLANEOUS)
CONN ST 1/4X3/8 BEN (MISCELLANEOUS) IMPLANT
CONN Y 3/8X3/8X3/8  BEN (MISCELLANEOUS)
CONN Y 3/8X3/8X3/8 BEN (MISCELLANEOUS) IMPLANT
CONT SPEC 4OZ STRL OR WHT (MISCELLANEOUS) ×6
COVER SURGICAL LIGHT HANDLE (MISCELLANEOUS) IMPLANT
DEFOGGER ANTIFOG KIT (MISCELLANEOUS) ×3 IMPLANT
DEFOGGER SCOPE WARMER CLEARIFY (MISCELLANEOUS) IMPLANT
DISSECTOR BLUNT TIP ENDO 5MM (MISCELLANEOUS) IMPLANT
DRAIN CHANNEL 28F RND 3/8 FF (WOUND CARE) IMPLANT
DRAIN CHANNEL 32F RND 10.7 FF (WOUND CARE) IMPLANT
DRAPE WARM FLUID 44X44 (DRAPES) ×3 IMPLANT
ELECT BLADE 6.5 EXT (BLADE) ×3 IMPLANT
ELECT REM PT RETURN 9FT ADLT (ELECTROSURGICAL) ×3
ELECTRODE REM PT RTRN 9FT ADLT (ELECTROSURGICAL) ×2 IMPLANT
GAUZE SPONGE 4X4 12PLY STRL (GAUZE/BANDAGES/DRESSINGS) ×3 IMPLANT
GAUZE SPONGE 4X4 12PLY STRL LF (GAUZE/BANDAGES/DRESSINGS) ×3 IMPLANT
GLOVE BIO SURGEON STRL SZ7 (GLOVE) ×6 IMPLANT
GOWN STRL REUS W/ TWL LRG LVL3 (GOWN DISPOSABLE) ×2 IMPLANT
GOWN STRL REUS W/ TWL XL LVL3 (GOWN DISPOSABLE) ×8 IMPLANT
GOWN STRL REUS W/TWL LRG LVL3 (GOWN DISPOSABLE) ×3
GOWN STRL REUS W/TWL XL LVL3 (GOWN DISPOSABLE) ×12
HANDLE STAPLE ENDO GIA SHORT (STAPLE)
HEMOSTAT SURGICEL 2X14 (HEMOSTASIS) IMPLANT
KIT BASIN OR (CUSTOM PROCEDURE TRAY) ×3 IMPLANT
KIT SUCTION CATH 14FR (SUCTIONS) IMPLANT
KIT TURNOVER KIT B (KITS) ×3 IMPLANT
NEEDLE 22X1 1/2 (OR ONLY) (NEEDLE) ×3 IMPLANT
NEEDLE SPNL 18GX3.5 QUINCKE PK (NEEDLE) IMPLANT
NS IRRIG 1000ML POUR BTL (IV SOLUTION) ×6 IMPLANT
PACK CHEST (CUSTOM PROCEDURE TRAY) ×3 IMPLANT
PACK UNIVERSAL I (CUSTOM PROCEDURE TRAY) ×6 IMPLANT
PAD ARMBOARD 7.5X6 YLW CONV (MISCELLANEOUS) ×6 IMPLANT
POUCH ENDO CATCH II 15MM (MISCELLANEOUS) IMPLANT
POUCH SPECIMEN RETRIEVAL 10MM (ENDOMECHANICALS) IMPLANT
SCISSORS LAP 5X35 DISP (ENDOMECHANICALS) IMPLANT
SEALANT PROGEL (MISCELLANEOUS) IMPLANT
SEALANT SURG COSEAL 4ML (VASCULAR PRODUCTS) IMPLANT
SEALANT SURG COSEAL 8ML (VASCULAR PRODUCTS) IMPLANT
SEALER LIGASURE MARYLAND 30 (ELECTROSURGICAL) ×3 IMPLANT
SET IRRIG TUBING LAPAROSCOPIC (IRRIGATION / IRRIGATOR) IMPLANT
SPECIMEN JAR MEDIUM (MISCELLANEOUS) IMPLANT
SPONGE INTESTINAL PEANUT (DISPOSABLE) IMPLANT
SPONGE TONSIL TAPE 1 RFD (DISPOSABLE) ×6 IMPLANT
STAPLER ENDO GIA 12MM SHORT (STAPLE) IMPLANT
STAPLER VISISTAT 35W (STAPLE) ×3 IMPLANT
STOPCOCK 4 WAY LG BORE MALE ST (IV SETS) IMPLANT
SUT MNCRL AB 3-0 PS2 18 (SUTURE) IMPLANT
SUT MON AB 2-0 CT1 36 (SUTURE) IMPLANT
SUT PDS AB 1 CTX 36 (SUTURE) IMPLANT
SUT PROLENE 4 0 RB 1 (SUTURE)
SUT PROLENE 4-0 RB1 .5 CRCL 36 (SUTURE) IMPLANT
SUT SILK  1 MH (SUTURE) ×3
SUT SILK 1 MH (SUTURE) ×2 IMPLANT
SUT SILK 1 TIES 10X30 (SUTURE) IMPLANT
SUT SILK 2 0 SH (SUTURE) IMPLANT
SUT SILK 2 0SH CR/8 30 (SUTURE) IMPLANT
SUT VIC AB 1 CTX 36 (SUTURE)
SUT VIC AB 1 CTX36XBRD ANBCTR (SUTURE) IMPLANT
SUT VIC AB 2-0 CT1 27 (SUTURE) ×6
SUT VIC AB 2-0 CT1 TAPERPNT 27 (SUTURE) ×4 IMPLANT
SUT VIC AB 2-0 SH 27 (SUTURE) ×3
SUT VIC AB 2-0 SH 27XBRD (SUTURE) ×2 IMPLANT
SUT VIC AB 2-0 UR6 27 (SUTURE) ×3 IMPLANT
SUT VIC AB 3-0 SH 27 (SUTURE) ×3
SUT VIC AB 3-0 SH 27X BRD (SUTURE) ×2 IMPLANT
SUT VICRYL 0 UR6 27IN ABS (SUTURE) IMPLANT
SUT VICRYL 2 TP 1 (SUTURE) IMPLANT
SYR 10ML LL (SYRINGE) IMPLANT
SYR 30ML LL (SYRINGE) ×3 IMPLANT
SYR 50ML LL SCALE MARK (SYRINGE) IMPLANT
SYSTEM SAHARA CHEST DRAIN ATS (WOUND CARE) ×3 IMPLANT
TAPE CLOTH 4X10 WHT NS (GAUZE/BANDAGES/DRESSINGS) ×3 IMPLANT
TAPE CLOTH SURG 4X10 WHT LF (GAUZE/BANDAGES/DRESSINGS) ×3 IMPLANT
TIP APPLICATOR SPRAY EXTEND 16 (VASCULAR PRODUCTS) IMPLANT
TOWEL GREEN STERILE (TOWEL DISPOSABLE) IMPLANT
TOWEL GREEN STERILE FF (TOWEL DISPOSABLE) ×3 IMPLANT
TRAY FOLEY MTR SLVR 16FR STAT (SET/KITS/TRAYS/PACK) IMPLANT
TROCAR XCEL BLADELESS 5X75MML (TROCAR) ×3 IMPLANT
TUBE CONNECTING 20X1/4 (TUBING) ×3 IMPLANT
TUBING EXTENTION W/L.L. (IV SETS) IMPLANT
WATER STERILE IRR 1000ML POUR (IV SOLUTION) ×3 IMPLANT
YANKAUER SUCT BULB TIP NO VENT (SUCTIONS) ×3 IMPLANT
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
## 2020-05-31 NOTE — Progress Notes (Signed)
Pt had ct this shift as well as procedure later in shift which prevented this nurse from being able to obtain sputum sample for culture. It was explained to pt that sample would be needed but could be done when more alert. Pt and visitor verbalized understanding. It was reported to night shift nurse that sputum needed to be obtained.  Pt NG is currently attached to low intermittent suction. Drainage is clear with some red tinge. Abd is less distended than it was this morning and pt reports less discomfort.  There is only one chest tube upon pt return to unit and there is frank red blood in tube. Mayford Knife RN

## 2020-05-31 NOTE — Progress Notes (Addendum)
This RN went in to assess patients pain level for medication administration. When this RN asked patient his pain on a score of 0-10 patient stated "That is none of your business. If you don't have dry medication like pills there is no point". This RN tried to educate patient on the reason for IV medication and NG tube and then patient became verbally aggressive with RN. Patient stated "I don't need your smart mouth trying to talk to me. I want new doctors and new nurses on this floor that can give me more medicine". When asking if patient still wanted IV medication he stated "Give me the medication and get out of my room. I just want to get medicine and get out of this hospital. I don't want to see you or the doctors.". This RN administered medication and educated patient on medication administration. Patient was resting in bed with no distress noted upon exiting room.

## 2020-05-31 NOTE — Anesthesia Procedure Notes (Signed)
Arterial Line Insertion Performed by: Lutricia Feil, RN, CRNA  Patient location: Pre-op. Preanesthetic checklist: patient identified, IV checked, site marked, risks and benefits discussed, surgical consent, monitors and equipment checked, pre-op evaluation, timeout performed and anesthesia consent Left, radial was placed Catheter size: 20 G Hand hygiene performed , maximum sterile barriers used  and Seldinger technique used Allen's test indicative of satisfactory collateral circulation Attempts: 1 Procedure performed without using ultrasound guided technique. Following insertion, dressing applied and Biopatch. Post procedure assessment: normal  Patient tolerated the procedure well with no immediate complications.

## 2020-05-31 NOTE — Progress Notes (Signed)
OT Cancellation Note  Patient Details Name: Brian Duran MRN: [id number] DOB: 06-16-1986   Cancelled Treatment:    Reason Eval/Treat Not Completed: Patient at procedure or test/ unavailable (Pt at CT. OT to follow next available treatment time.)[name], OTR/L Acute Rehabilitation Services Pager: [phone] Office: [phone]   Zion Lint C 05/31/2020, 3:15 PM
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
## 2020-05-31 NOTE — Progress Notes (Signed)
PT Cancellation Note  Patient Details Name: Brian Duran MRN: [id number] DOB: July 13, 1986   Cancelled Treatment:    Reason Eval/Treat Not Completed: Patient at procedure or test/unavailable   Jerolyn Center, PT Pager [phone]  Zena Amos 05/31/2020, 3:20 PM

## 2020-05-31 NOTE — Transfer of Care (Signed)
Immediate Anesthesia Transfer of Care Note  Patient: Brian Duran  Procedure(s) Performed: VIDEO ASSISTED THORACOSCOPY Decortication  (Right ) CHEST TUBE INSERTION USING A 28 FR. ARGYLE (Right Chest)  Patient Location: PACU  Anesthesia Type:General  Level of Consciousness: awake, drowsy and patient cooperative  Airway & Oxygen Therapy: Patient Spontanous Breathing and Patient connected to face mask oxygen  Post-op Assessment: Report given to RN and Post -op Vital signs reviewed and stable  Post vital signs: Reviewed and stable  Last Vitals:  Vitals Value Taken Time  BP 128/79 05/31/20 1756  Temp 37.9 C 05/31/20 1740  Pulse 109 05/31/20 1806  Resp 20 05/31/20 1806  SpO2 95 % 05/31/20 1806  Vitals shown include unvalidated device data.  Last Pain:  Vitals:   05/31/20 1740  TempSrc:   PainSc: Asleep      Patients Stated Pain Goal: 0 (05/28/20 1206)  Complications: No complications documented.  See intra-op note
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
## 2020-05-31 NOTE — Anesthesia Preprocedure Evaluation (Addendum)
Anesthesia Evaluation  Patient identified by MRN, date of birth, ID band Patient awake    Reviewed: Allergy & Precautions, NPO status , Patient's Chart, lab work & pertinent test results  Airway Mallampati: I  TM Distance: >3 FB Neck ROM: Full    Dental  (+) Teeth Intact, Dental Advisory Given   Pulmonary neg pulmonary ROS,     + decreased breath sounds      Cardiovascular negative cardio ROS   Rhythm:Regular Rate:Tachycardia     Neuro/Psych negative neurological ROS  negative psych ROS   GI/Hepatic negative GI ROS, Neg liver ROS,   Endo/Other  negative endocrine ROS  Renal/GU negative Renal ROS     Musculoskeletal negative musculoskeletal ROS (+)   Abdominal Normal abdominal exam  (+)   Peds  Hematology negative hematology ROS (+)   Anesthesia Other Findings   Reproductive/Obstetrics                            Anesthesia Physical Anesthesia Plan  ASA: III  Anesthesia Plan: General   Post-op Pain Management:    Induction: Intravenous  PONV Risk Score and Plan: 3 and Ondansetron, Dexamethasone and Midazolam  Airway Management Planned: Double Lumen EBT  Additional Equipment: Arterial line  Intra-op Plan:   Post-operative Plan: Possible Post-op intubation/ventilation  Informed Consent: I have reviewed the patients History and Physical, chart, labs and discussed the procedure including the risks, benefits and alternatives for the proposed anesthesia with the patient or authorized representative who has indicated his/her understanding and acceptance.     Dental advisory given  Plan Discussed with: CRNA  Anesthesia Plan Comments:        Anesthesia Quick Evaluation
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
## 2020-05-31 NOTE — Anesthesia Procedure Notes (Signed)
Procedure Name: Intubation Performed by: Valda Favia, CRNA Pre-anesthesia Checklist: Patient identified, Emergency Drugs available, Suction available, Patient being monitored and Timeout performed Patient Re-evaluated:Patient Re-evaluated prior to induction Oxygen Delivery Method: Circle system utilized Preoxygenation: Pre-oxygenation with 100% oxygen Induction Type: IV induction and Rapid sequence Laryngoscope Size: Mac and 4 Grade View: Grade I Endobronchial tube: Double lumen EBT and Left and 39 Fr Number of attempts: 1 Airway Equipment and Method: Stylet Placement Confirmation: ETT inserted through vocal cords under direct vision,  positive ETCO2 and breath sounds checked- equal and bilateral Tube secured with: Tape Dental Injury: Teeth and Oropharynx as per pre-operative assessment  Comments: Viva sight tube DL and intubation by Terence Lux, SRNA

## 2020-06-01 ENCOUNTER — Encounter (HOSPITAL_COMMUNITY): Payer: Self-pay | Admitting: Thoracic Surgery (Cardiothoracic Vascular Surgery)

## 2020-06-01 ENCOUNTER — Inpatient Hospital Stay (HOSPITAL_COMMUNITY): Payer: No Typology Code available for payment source

## 2020-06-01 LAB — CBC
HCT: 24.2 % — ABNORMAL LOW (ref 39.0–52.0)
Hemoglobin: 8.1 g/dL — ABNORMAL LOW (ref 13.0–17.0)
MCH: 29.7 pg (ref 26.0–34.0)
MCHC: 33.5 g/dL (ref 30.0–36.0)
MCV: 88.6 fL (ref 80.0–100.0)
Platelets: 112 10*3/uL — ABNORMAL LOW (ref 150–400)
RBC: 2.73 MIL/uL — ABNORMAL LOW (ref 4.22–5.81)
RDW: 14.3 % (ref 11.5–15.5)
WBC: 16.3 10*3/uL — ABNORMAL HIGH (ref 4.0–10.5)
nRBC: 0 % (ref 0.0–0.2)

## 2020-06-01 LAB — BLOOD GAS, ARTERIAL
Acid-Base Excess: 3.5 mmol/L — ABNORMAL HIGH (ref 0.0–2.0)
Bicarbonate: 27.3 mmol/L (ref 20.0–28.0)
Drawn by: 270271
FIO2: 32
O2 Saturation: 85.6 %
Patient temperature: 37
pCO2 arterial: 40.2 mmHg (ref 32.0–48.0)
pH, Arterial: 7.447 (ref 7.350–7.450)
pO2, Arterial: 50.9 mmHg — ABNORMAL LOW (ref 83.0–108.0)

## 2020-06-01 LAB — URINE CULTURE: Culture: <10000 — AB

## 2020-06-01 LAB — BASIC METABOLIC PANEL
Anion gap: 9 (ref 5–15)
BUN: 15 mg/dL (ref 6–20)
CO2: 27 mmol/L (ref 22–32)
Calcium: 8 mg/dL — ABNORMAL LOW (ref 8.9–10.3)
Chloride: 111 mmol/L (ref 98–111)
Creatinine, Ser: 1.2 mg/dL (ref 0.61–1.24)
GFR calc Af Amer: >60 mL/min (ref >60)
GFR calc non Af Amer: >60 mL/min (ref >60)
Glucose, Bld: 128 mg/dL — ABNORMAL HIGH (ref 70–99)
Potassium: 3.9 mmol/L (ref 3.5–5.1)
Sodium: 147 mmol/L — ABNORMAL HIGH (ref 135–145)

## 2020-06-01 LAB — GLUCOSE, CAPILLARY
Glucose-Capillary: 115 mg/dL — ABNORMAL HIGH (ref 70–99)
Glucose-Capillary: 123 mg/dL — ABNORMAL HIGH (ref 70–99)
Glucose-Capillary: 106 mg/dL — ABNORMAL HIGH (ref 70–99)
Glucose-Capillary: 111 mg/dL — ABNORMAL HIGH (ref 70–99)

## 2020-06-01 LAB — EXPECTORATED SPUTUM ASSESSMENT W GRAM STAIN, RFLX TO RESP C

## 2020-06-01 MED ORDER — ENOXAPARIN SODIUM 30 MG/0.3ML ~~LOC~~ SOLN
30.0000 mg | Freq: Two times a day (BID) | SUBCUTANEOUS | Status: DC
  Administered 2020-06-01 – 2020-06-06 (×12): 30 mg via SUBCUTANEOUS
  Filled 2020-06-01 (×12): qty 0.3
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
## 2020-06-01 NOTE — Anesthesia Postprocedure Evaluation (Signed)
Anesthesia Post Note  Patient: Brian Duran  Procedure(s) Performed: VIDEO ASSISTED THORACOSCOPY Decortication  (Right ) CHEST TUBE INSERTION USING A 28 FR. ARGYLE (Right Chest)     Patient location during evaluation: PACU Anesthesia Type: General Level of consciousness: awake and alert Pain management: pain level controlled Vital Signs Assessment: post-procedure vital signs reviewed and stable Respiratory status: spontaneous breathing, nonlabored ventilation, respiratory function stable and patient connected to nasal cannula oxygen Cardiovascular status: blood pressure returned to baseline and stable Postop Assessment: no apparent nausea or vomiting Anesthetic complications: no Comments: Possible episode of SVT vs ST in OR. Pt responsive throughout. Esmolol administered. HR returned to baseline. BP stable in PACU.   No complications documented.           Shelton Silvas

## 2020-06-02 ENCOUNTER — Inpatient Hospital Stay (HOSPITAL_COMMUNITY): Payer: No Typology Code available for payment source

## 2020-06-02 LAB — COMPREHENSIVE METABOLIC PANEL
ALT: 191 U/L — ABNORMAL HIGH (ref 0–44)
AST: 73 U/L — ABNORMAL HIGH (ref 15–41)
Albumin: 2.1 g/dL — ABNORMAL LOW (ref 3.5–5.0)
Alkaline Phosphatase: 85 U/L (ref 38–126)
Anion gap: 11 (ref 5–15)
BUN: 13 mg/dL (ref 6–20)
CO2: 25 mmol/L (ref 22–32)
Calcium: 7.8 mg/dL — ABNORMAL LOW (ref 8.9–10.3)
Chloride: 106 mmol/L (ref 98–111)
Creatinine, Ser: 1.19 mg/dL (ref 0.61–1.24)
GFR calc Af Amer: >60 mL/min (ref >60)
GFR calc non Af Amer: >60 mL/min (ref >60)
Glucose, Bld: 111 mg/dL — ABNORMAL HIGH (ref 70–99)
Potassium: 3.5 mmol/L (ref 3.5–5.1)
Sodium: 142 mmol/L (ref 135–145)
Total Bilirubin: 1.8 mg/dL — ABNORMAL HIGH (ref 0.3–1.2)
Total Protein: 4.9 g/dL — ABNORMAL LOW (ref 6.5–8.1)

## 2020-06-02 LAB — CULTURE, BLOOD (ROUTINE X 2)
Culture: NO GROWTH
Culture: NO GROWTH

## 2020-06-02 LAB — CBC
HCT: 23 % — ABNORMAL LOW (ref 39.0–52.0)
Hemoglobin: 7.6 g/dL — ABNORMAL LOW (ref 13.0–17.0)
MCH: 29.8 pg (ref 26.0–34.0)
MCHC: 33 g/dL (ref 30.0–36.0)
MCV: 90.2 fL (ref 80.0–100.0)
Platelets: 135 10*3/uL — ABNORMAL LOW (ref 150–400)
RBC: 2.55 MIL/uL — ABNORMAL LOW (ref 4.22–5.81)
RDW: 14.1 % (ref 11.5–15.5)
WBC: 16.2 10*3/uL — ABNORMAL HIGH (ref 4.0–10.5)
nRBC: 0.5 % — ABNORMAL HIGH (ref 0.0–0.2)

## 2020-06-02 LAB — GLUCOSE, CAPILLARY
Glucose-Capillary: 123 mg/dL — ABNORMAL HIGH (ref 70–99)
Glucose-Capillary: 97 mg/dL (ref 70–99)
Glucose-Capillary: 122 mg/dL — ABNORMAL HIGH (ref 70–99)
Glucose-Capillary: 86 mg/dL (ref 70–99)
Glucose-Capillary: 95 mg/dL (ref 70–99)
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
## 2020-06-03 ENCOUNTER — Inpatient Hospital Stay (HOSPITAL_COMMUNITY): Payer: No Typology Code available for payment source

## 2020-06-03 LAB — CBC
HCT: 24.6 % — ABNORMAL LOW (ref 39.0–52.0)
Hemoglobin: 7.9 g/dL — ABNORMAL LOW (ref 13.0–17.0)
MCH: 28.7 pg (ref 26.0–34.0)
MCHC: 32.1 g/dL (ref 30.0–36.0)
MCV: 89.5 fL (ref 80.0–100.0)
Platelets: 164 10*3/uL (ref 150–400)
RBC: 2.75 MIL/uL — ABNORMAL LOW (ref 4.22–5.81)
RDW: 13.7 % (ref 11.5–15.5)
WBC: 20.3 10*3/uL — ABNORMAL HIGH (ref 4.0–10.5)
nRBC: 0.2 % (ref 0.0–0.2)

## 2020-06-03 LAB — GLUCOSE, CAPILLARY
Glucose-Capillary: 105 mg/dL — ABNORMAL HIGH (ref 70–99)
Glucose-Capillary: 108 mg/dL — ABNORMAL HIGH (ref 70–99)
Glucose-Capillary: 109 mg/dL — ABNORMAL HIGH (ref 70–99)
Glucose-Capillary: 111 mg/dL — ABNORMAL HIGH (ref 70–99)

## 2020-06-03 LAB — CULTURE, RESPIRATORY W GRAM STAIN: Culture: NORMAL

## 2020-06-03 MED ORDER — IPRATROPIUM-ALBUTEROL 0.5-2.5 (3) MG/3ML IN SOLN
3.0000 mL | Freq: Four times a day (QID) | RESPIRATORY_TRACT | Status: DC
  Administered 2020-06-03: 3 mL via RESPIRATORY_TRACT
  Filled 2020-06-03: qty 3

## 2020-06-03 MED ORDER — PIPERACILLIN-TAZOBACTAM 3.375 G IVPB
3.3750 g | Freq: Three times a day (TID) | INTRAVENOUS | Status: DC
  Administered 2020-06-03 – 2020-06-07 (×12): 3.375 g via INTRAVENOUS
  Filled 2020-06-03 (×12): qty 50

## 2020-06-03 MED ORDER — IPRATROPIUM-ALBUTEROL 0.5-2.5 (3) MG/3ML IN SOLN: 3.0000 mL | RESPIRATORY_TRACT | Status: DC | PRN

## 2020-06-03 MED ORDER — MORPHINE SULFATE (PF) 2 MG/ML IV SOLN
1.0000 mg | INTRAVENOUS | Status: DC | PRN
  Administered 2020-06-05 – 2020-06-06 (×2): 2 mg via INTRAVENOUS
  Filled 2020-06-03 (×2): qty 1

## 2020-06-03 MED ORDER — IOHEXOL 300 MG/ML  SOLN
75.0000 mL | Freq: Once | INTRAMUSCULAR | Status: AC | PRN
  Administered 2020-06-03: 75 mL via INTRAVENOUS

## 2020-06-03 MED ORDER — METHOCARBAMOL 750 MG PO TABS
750.0000 mg | ORAL_TABLET | Freq: Three times a day (TID) | ORAL | Status: DC
  Administered 2020-06-03 – 2020-06-07 (×12): 750 mg via ORAL
  Filled 2020-06-03 (×12): qty 1

## 2020-06-03 MED ORDER — OXYCODONE HCL 5 MG PO TABS
5.0000 mg | ORAL_TABLET | ORAL | Status: DC | PRN
  Administered 2020-06-03 – 2020-06-07 (×13): 10 mg via ORAL
  Filled 2020-06-03 (×13): qty 2
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
## 2020-06-03 NOTE — Progress Notes (Signed)
OT Cancellation Note  Patient Details Name: Brian Duran MRN: [id number] DOB: October 31, 1985   Cancelled Treatment:    Reason Eval/Treat Not Completed: Other (comment) (Pt is OTF for test at this time. Has been more hypoxic today. Will continue to follow as available and appropriate.)  Dalphine Handing, MSOT, OTR/L Acute Rehabilitation Services [facility] Office Number: [phone] Pager: [phone]  Dalphine Handing 06/03/2020, 12:14 PM

## 2020-06-03 NOTE — Progress Notes (Signed)
Patient ID: Brian Duran, male   DOB: Oct 21, 1986, 34 y.o.   MRN: [id number] CT chest shows no PE but does show PNA (possibly necrotizing) of R lung, ARDS pattern L lung, and possible infection of liver hematoma. Will start Zosyn and scheduled Duonebs. I discussed the findings and the plan with him.  Violeta Gelinas, MD, MPH, FACS Please use [email or web address] to contact on call provider

## 2020-06-04 LAB — CBC
HCT: 24.6 % — ABNORMAL LOW (ref 39.0–52.0)
Hemoglobin: 8.1 g/dL — ABNORMAL LOW (ref 13.0–17.0)
MCH: 29.6 pg (ref 26.0–34.0)
MCHC: 32.9 g/dL (ref 30.0–36.0)
MCV: 89.8 fL (ref 80.0–100.0)
Platelets: 171 10*3/uL (ref 150–400)
RBC: 2.74 MIL/uL — ABNORMAL LOW (ref 4.22–5.81)
RDW: 13.8 % (ref 11.5–15.5)
WBC: 19.2 10*3/uL — ABNORMAL HIGH (ref 4.0–10.5)
nRBC: 0.3 % — ABNORMAL HIGH (ref 0.0–0.2)

## 2020-06-04 LAB — BASIC METABOLIC PANEL
Anion gap: 9 (ref 5–15)
BUN: 10 mg/dL (ref 6–20)
CO2: 26 mmol/L (ref 22–32)
Calcium: 7.7 mg/dL — ABNORMAL LOW (ref 8.9–10.3)
Chloride: 101 mmol/L (ref 98–111)
Creatinine, Ser: 1.04 mg/dL (ref 0.61–1.24)
GFR calc Af Amer: >60 mL/min (ref >60)
GFR calc non Af Amer: >60 mL/min (ref >60)
Glucose, Bld: 123 mg/dL — ABNORMAL HIGH (ref 70–99)
Potassium: 3.1 mmol/L — ABNORMAL LOW (ref 3.5–5.1)
Sodium: 136 mmol/L (ref 135–145)

## 2020-06-04 LAB — GLUCOSE, CAPILLARY
Glucose-Capillary: 119 mg/dL — ABNORMAL HIGH (ref 70–99)
Glucose-Capillary: 105 mg/dL — ABNORMAL HIGH (ref 70–99)
Glucose-Capillary: 123 mg/dL — ABNORMAL HIGH (ref 70–99)
Glucose-Capillary: 103 mg/dL — ABNORMAL HIGH (ref 70–99)

## 2020-06-04 MED ORDER — POTASSIUM CHLORIDE CRYS ER 20 MEQ PO TBCR
40.0000 meq | EXTENDED_RELEASE_TABLET | Freq: Once | ORAL | Status: AC
  Administered 2020-06-04: 40 meq via ORAL
  Filled 2020-06-04: qty 2
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
## 2020-06-04 NOTE — Progress Notes (Signed)
4 Days Post-Op Procedure(s) (LRB): VIDEO ASSISTED THORACOSCOPY Decortication  (Right) CHEST TUBE INSERTION USING A 28 FR. ARGYLE (Right) Subjective: C/o pain, "this hospital ain't the best"  Objective: Vital signs in last 24 hours: Temp:  [98.3 F (36.8 C)-101.2 F (38.4 C)] 98.6 F (37 C) (08/07 1215) Pulse Rate:  [80-104] 87 (08/07 1215) Cardiac Rhythm: Normal sinus rhythm (08/07 0917) Resp:  [23-32] 24 (08/07 1215) BP: (134-157)/(80-112) 134/112 (08/07 1215) SpO2:  [31 %-98 %] 87 % (08/07 1215)  Hemodynamic parameters for last 24 hours:    Intake/Output from previous day: 08/06 0701 - 08/07 0700 In: 600 [P.O.:600] Out: 2365 [Urine:2275; Chest Tube:90] Intake/Output this shift: Total I/O In: -  Out: 250 [Urine:250]  General appearance: alert and no distress Neurologic: intact Heart: regular rate and rhythm Lungs: diminished breath sounds right base Brown drainage from CT  Lab Results: Recent Labs    06/03/20 1403 06/04/20 0715  WBC 20.3* 19.2*  HGB 7.9* 8.1*  HCT 24.6* 24.6*  PLT 164 171   BMET:  Recent Labs    06/02/20 0449 06/04/20 0715  NA 142 136  K 3.5 3.1*  CL 106 101  CO2 25 26  GLUCOSE 111* 123*  BUN 13 10  CREATININE 1.19 1.04  CALCIUM 7.8* 7.7*    PT/INR: No results for input(s): LABPROT, INR in the last 72 hours. ABG    Component Value Date/Time   PHART 7.447 06/01/2020 0245   HCO3 27.3 06/01/2020 0245   TCO2 23 05/27/2020 0409   ACIDBASEDEF 5.0 (H) 05/27/2020 0409   O2SAT 85.6 06/01/2020 0245   CBG (last 3)  Recent Labs    06/03/20 2122 06/04/20 0823 06/04/20 1215  GLUCAP 111* 119* 105*    Assessment/Plan: S/P Procedure(s) (LRB): VIDEO ASSISTED THORACOSCOPY Decortication  (Right) CHEST TUBE INSERTION USING A 28 FR. ARGYLE (Right) -POD # 4 Decortication Drainage trending down Keep CT in place on water seal for now   LOS: 9 days    Loreli Slot 06/04/2020

## 2020-06-04 NOTE — Progress Notes (Signed)
On assessment, pt is diaphoretic. Pt is currently on 6L [place] and is at 91% Sp02. However, the waveform was not reliable, and this RN was told in hand-off that the pt had been on 2L [place]. Oxygen was decreased to 2L and 02 censor was changed. Pt sats in mid 80's. 02 adjusted and pt ended up requiring 7L [place] to maintain 92%. PA, Barnetta Chapel, made aware. See new orders.   Robina Ade, RN

## 2020-06-05 ENCOUNTER — Inpatient Hospital Stay (HOSPITAL_COMMUNITY): Payer: No Typology Code available for payment source

## 2020-06-05 LAB — AEROBIC/ANAEROBIC CULTURE W GRAM STAIN (SURGICAL/DEEP WOUND): Culture: NO GROWTH

## 2020-06-05 LAB — GLUCOSE, CAPILLARY
Glucose-Capillary: 112 mg/dL — ABNORMAL HIGH (ref 70–99)
Glucose-Capillary: 138 mg/dL — ABNORMAL HIGH (ref 70–99)
Glucose-Capillary: 124 mg/dL — ABNORMAL HIGH (ref 70–99)
Glucose-Capillary: 93 mg/dL (ref 70–99)

## 2020-06-05 NOTE — Progress Notes (Signed)
5 Days Post-Op Procedure(s) (LRB): VIDEO ASSISTED THORACOSCOPY Decortication  (Right) CHEST TUBE INSERTION USING A 28 FR. ARGYLE (Right) Subjective: C/o pain. "want to get this tube out and go home"  Objective: Vital signs in last 24 hours: Temp:  [98.6 F (37 C)-99.3 F (37.4 C)] 98.8 F (37.1 C) (08/08 0738) Pulse Rate:  [75-98] 82 (08/08 0738) Cardiac Rhythm: Normal sinus rhythm (08/08 1000) Resp:  [20-32] 20 (08/08 0738) BP: (134-151)/(79-112) 147/84 (08/08 0738) SpO2:  [87 %-98 %] 93 % (08/08 0738)  Hemodynamic parameters for last 24 hours:    Intake/Output from previous day: 08/07 0701 - 08/08 0700 In: -  Out: 2110 [Urine:2105; Chest Tube:5] Intake/Output this shift: No intake/output data recorded.  General appearance: alert, cooperative and no distress Neurologic: intact Heart: regular rate and rhythm Lungs: diminished breath sounds righ base  Lab Results: Recent Labs    06/03/20 1403 06/04/20 0715  WBC 20.3* 19.2*  HGB 7.9* 8.1*  HCT 24.6* 24.6*  PLT 164 171   BMET:  Recent Labs    06/04/20 0715  NA 136  K 3.1*  CL 101  CO2 26  GLUCOSE 123*  BUN 10  CREATININE 1.04  CALCIUM 7.7*    PT/INR: No results for input(s): LABPROT, INR in the last 72 hours. ABG    Component Value Date/Time   PHART 7.447 06/01/2020 0245   HCO3 27.3 06/01/2020 0245   TCO2 23 05/27/2020 0409   ACIDBASEDEF 5.0 (H) 05/27/2020 0409   O2SAT 85.6 06/01/2020 0245   CBG (last 3)  Recent Labs    06/04/20 1609 06/04/20 2148 06/05/20 0729  GLUCAP 123* 103* 112*    Assessment/Plan: S/P Procedure(s) (LRB): VIDEO ASSISTED THORACOSCOPY Decortication  (Right) CHEST TUBE INSERTION USING A 28 FR. ARGYLE (Right) -POD # 5 Drainage from tube has decreased although Pleuravac has been tipped over so hard to say for sure exactly how much has drained CXR shows worsening AS disease on left Plan per Trauma   LOS: 10 days    Loreli Slot 06/05/2020

## 2020-06-05 NOTE — Plan of Care (Signed)
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
## 2020-06-06 ENCOUNTER — Inpatient Hospital Stay (HOSPITAL_COMMUNITY): Payer: No Typology Code available for payment source

## 2020-06-06 LAB — COMPREHENSIVE METABOLIC PANEL
ALT: 90 U/L — ABNORMAL HIGH (ref 0–44)
AST: 53 U/L — ABNORMAL HIGH (ref 15–41)
Albumin: 2.1 g/dL — ABNORMAL LOW (ref 3.5–5.0)
Alkaline Phosphatase: 110 U/L (ref 38–126)
Anion gap: 12 (ref 5–15)
BUN: 8 mg/dL (ref 6–20)
CO2: 24 mmol/L (ref 22–32)
Calcium: 8 mg/dL — ABNORMAL LOW (ref 8.9–10.3)
Chloride: 102 mmol/L (ref 98–111)
Creatinine, Ser: 0.93 mg/dL (ref 0.61–1.24)
GFR calc Af Amer: >60 mL/min (ref >60)
GFR calc non Af Amer: >60 mL/min (ref >60)
Glucose, Bld: 132 mg/dL — ABNORMAL HIGH (ref 70–99)
Potassium: 3.3 mmol/L — ABNORMAL LOW (ref 3.5–5.1)
Sodium: 138 mmol/L (ref 135–145)
Total Bilirubin: 1.2 mg/dL (ref 0.3–1.2)
Total Protein: 5.6 g/dL — ABNORMAL LOW (ref 6.5–8.1)

## 2020-06-06 LAB — CBC
HCT: 25.3 % — ABNORMAL LOW (ref 39.0–52.0)
Hemoglobin: 8 g/dL — ABNORMAL LOW (ref 13.0–17.0)
MCH: 28.4 pg (ref 26.0–34.0)
MCHC: 31.6 g/dL (ref 30.0–36.0)
MCV: 89.7 fL (ref 80.0–100.0)
Platelets: 299 10*3/uL (ref 150–400)
RBC: 2.82 MIL/uL — ABNORMAL LOW (ref 4.22–5.81)
RDW: 13.6 % (ref 11.5–15.5)
WBC: 18.3 10*3/uL — ABNORMAL HIGH (ref 4.0–10.5)
nRBC: 0 % (ref 0.0–0.2)

## 2020-06-06 LAB — GLUCOSE, CAPILLARY
Glucose-Capillary: 112 mg/dL — ABNORMAL HIGH (ref 70–99)
Glucose-Capillary: 105 mg/dL — ABNORMAL HIGH (ref 70–99)
Glucose-Capillary: 103 mg/dL — ABNORMAL HIGH (ref 70–99)
Glucose-Capillary: 108 mg/dL — ABNORMAL HIGH (ref 70–99)

## 2020-06-06 NOTE — Progress Notes (Signed)
Removed stitches and chest tube today around 12 pm.  Charge nurse was present and a lot of drainage was present during and after removal. Informed the on call PA and she said to reinforce dressing.
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
## 2020-06-07 ENCOUNTER — Encounter (HOSPITAL_COMMUNITY): Payer: Self-pay | Admitting: Emergency Medicine

## 2020-06-07 ENCOUNTER — Inpatient Hospital Stay (HOSPITAL_COMMUNITY): Payer: No Typology Code available for payment source

## 2020-06-07 LAB — GLUCOSE, CAPILLARY: Glucose-Capillary: 108 mg/dL — ABNORMAL HIGH (ref 70–99)

## 2020-06-07 MED ORDER — OXYCODONE HCL 10 MG PO TABS: 10.0000 mg | ORAL_TABLET | Freq: Four times a day (QID) | ORAL | 0 refills | Status: DC | PRN

## 2020-06-07 MED ORDER — METHOCARBAMOL 750 MG PO TABS: 750.0000 mg | ORAL_TABLET | Freq: Three times a day (TID) | ORAL | 0 refills | Status: DC | PRN

## 2020-06-07 MED ORDER — DOCUSATE SODIUM 100 MG PO CAPS
100.0000 mg | ORAL_CAPSULE | Freq: Two times a day (BID) | ORAL | 0 refills | Status: DC
Start: 2020-06-07 — End: 2021-02-26

## 2020-06-07 MED ORDER — POTASSIUM CHLORIDE CRYS ER 20 MEQ PO TBCR
40.0000 meq | EXTENDED_RELEASE_TABLET | Freq: Once | ORAL | Status: AC
  Administered 2020-06-07: 40 meq via ORAL
  Filled 2020-06-07: qty 2

## 2020-06-07 NOTE — Plan of Care (Signed)

## 2020-06-07 NOTE — Discharge Instructions (Signed)
Thoracoscopy, Care After This sheet gives you information about how to care for yourself after your procedure. Your health care provider may also give you more specific instructions. If you have problems or questions, contact your health care provider. What can I expect after the procedure? After the procedure, it is common to have pain and soreness in the surgical area. Follow these instructions at home: Incision care   Follow instructions from your health care provider about how to take care of your incision. Make sure you: ? Wash your hands with soap and water before you change your bandage (dressing). If soap and water are not available, use hand sanitizer. ? Change your dressing as told by your health care provider. ? Leave stitches (sutures), skin glue, or adhesive strips in place. These skin closures may need to stay in place for 2 weeks or longer. If adhesive strip edges start to loosen and curl up, you may trim the loose edges. Do not remove adhesive strips completely unless your health care provider tells you to do that.  Check your incision areas every day for signs of infection. Check for: ? Redness, swelling, or pain. ? Fluid or blood. ? Warmth. ? Pus or a bad smell.  Do not take baths, swim, or use a hot tub until your health care provider approves. You may take showers. Medicines  Take over-the-counter and prescription medicines only as told by your health care provider.  If you were prescribed an antibiotic medicine, take it as told by your health care provider. Do not stop taking the antibiotic even if you start to feel better.  Do not drive or use heavy machinery while taking prescription pain medicine.  If you are taking prescription pain medicine, take actions to prevent or treat constipation. Your health care provider may recommend that you: ? Drink enough fluid to keep your urine pale yellow. ? Eat foods that are high in fiber, such as fresh fruits and vegetables,  whole grains, and beans. ? Limit foods that are high in fat and processed sugars, such as fried and sweet foods. ? Take an over-the-counter or prescription medicine for constipation. Managing pain, stiffness, and swelling   If directed, put ice on the affected area: ? Put ice in a plastic bag. ? Place a towel between your skin and the bag. ? Leave the ice on for 20 minutes, 2-3 times a day. Preventing lung infection  To prevent pneumonia and to keep your lungs healthy: ? Try to cough often. If it hurts to cough, hold a pillow against your chest as you cough. ? Take deep breaths or do breathing exercises as instructed by your health care provider. ? If you were given an incentive spirometer, use it as directed by your health care provider. General instructions  Do not lift anything that is heavier than 10 lb (4.5 kg), or the limit that you are told, until your health care provider says that it is safe.  Do not use any products that contain nicotine or tobacco, such as cigarettes and e-cigarettes. These can delay healing after surgery. If you need help quitting, ask your health care provider.  Avoid driving until your health care provider approves.  If you have a chest drainage tube, care for it as instructed by your health care provider. Do not travel by airplane after the chest drainage tube is removed until your health care provider approves.  Keep all follow-up visits as told by your health care provider. This is important. Contact   a health care provider if:  You have a fever.  Pain medicines do not ease your pain.  You have redness, swelling, or increasing pain in your incision area.  You develop a cough that does not go away, or you are coughing up mucus that is yellow or green. Get help right away if:  You have fluid, blood, or pus coming from your incision.  There is a bad smell coming from your incision or dressing.  You develop a rash.  You cough up blood.  You  develop light-headedness, or you feel faint.  You have difficulty breathing.  You develop chest pain.  Your heartbeat feels irregular or very fast. These symptoms may represent a serious problem that is an emergency. Do not wait to see if the symptoms will go away. Get medical help right away. Call your local emergency services (911 in the [place] Do not drive yourself to the hospital. Summary  Follow instructions from your health care provider about how to take care of your incision.  Do not drive or use heavy machinery while taking prescription pain medicine.  Leave stitches (sutures), skin glue, or adhesive strips in place.  Check your incision areas every day for signs of infection. This information is not intended to replace advice given to you by your health care provider. Make sure you discuss any questions you have with your health care provider. Document Revised: 09/27/2017 Document Reviewed: 09/24/2017 Elsevier Patient Education  2020 [employer] YOU MAY SHOWER WITH SOAP AND WATER BUT DO NOT GO SWIMMING/SOAK IN A TUB UNTIL YOU SEE YOUR DOCTOR. KEEP YOUR RIGHT CHEST TUBE SITE COVERED WHILE YOU SHOWER UNTIL IT CLOSES UP/SCABS OVER. APPLY A CLEAN, DRY BANDAGE AFTER YOUR SHOWER.   CONTINUE TO INCREASE YOUR ACTIVITY DAILY AS TOLERATED -- AVOID HEAVY LIFTING, PUSHING, PULLING AND CONTACT SPORTS FOR 6 WEEKS.   USE YOUR INCENTIVE SPIROMETER (BREATHING TOOL) MULTIPLE TIMES DAILY AT HOME.  Liver Laceration  A liver laceration is a tear or a cut in the liver. The liver is an organ that is involved in many important bodily functions. Sometimes, a liver laceration can be a very serious injury. It can cause a lot of bleeding, and surgery may be needed. Other times, a liver laceration may be minor, and bed rest may be all that is needed. Either way, treatment in a hospital is almost always required. Liver lacerations are categorized in grades from 1 to 5. Low numbers identify lacerations  that are less severe than lacerations with high numbers.  Grade 1: This is a tear in the outer lining of the liver. It is less than  inch (1 cm) deep.  Grade 2: This is a tear that is about  inch to 1 inch (1 to 3 cm) deep. It is less than 4 inches (10 cm) long.  Grade 3: This is a tear that is slightly more than 1 inch (3 cm) deep.  Grades 4 and 5: These lacerations are very deep. They affect a large part of the liver. What are the causes? This condition may be caused by:  A forceful hit to the area around the liver (blunt trauma), such as in a car crash. Blunt trauma can tear the liver even though it does not break the skin.  An injury in which an object goes through the skin and into the liver (penetrating injury), such as a stab or gunshot wound. What are the signs or symptoms? Common symptoms of this condition include:  A  swollen and firm abdomen.  Pain in the abdomen.  Tenderness when pressing on the right side of the abdomen. Other symptoms include:  Bleeding from a penetrating wound.  Bruises on the abdomen.  A fast heartbeat.  Taking quick breaths.  Feeling weak and dizzy. How is this diagnosed? To diagnose this condition, your health care provider will do a physical exam and ask about any injuries to the right side of your abdomen. You may have various tests, such as:  Blood tests. Your blood may be tested every few hours. This will show whether you are losing blood.  CT scan. This test is done to check for laceration or bleeding.  Laparoscopy. This involves placing a small camera into the abdomen and looking directly at the surface of the liver. How is this treated? Treatment depends on how deep the laceration is and how much bleeding you have. Treatment options include:  Monitoring and bed rest at the hospital. You will have tests often.  Receiving donated blood through an IV tube (transfusion) to replace blood that you have lost. You may need several  transfusions.  Surgery to pack gauze pads or special material around the laceration to help it heal or to repair the laceration. Follow these instructions at home:  Take over-the-counter and prescription medicines only as told by your health care provider. Do not take any other medicines unless you ask your health care provider about them first.  Do not drive or use heavy machinery while taking prescription pain medicines.  Rest and limit your activity as told by your health care provider. It may be several months before you can return to your usual routine. Do not participate in activities that involve physical contact or require extra energy until your health care provider approves.  Keep all follow-up visits as told by your health care provider. This is important. Contact a health care provider if:  Your abdominal pain does not go away.  You feel more weak and tired than usual. Get help right away if:  Your abdominal pain gets worse.  You have a cut on your skin that: ? Has more redness, swelling, or pain around it. ? Has more fluid or blood coming from it. ? Feels warm to the touch. ? Has pus or a bad smell coming from it.  You feel dizzy or very weak.  You have trouble breathing.  You have a fever. This information is not intended to replace advice given to you by your health care provider. Make sure you discuss any questions you have with your health care provider. Document Revised: 09/27/2017 Document Reviewed: 06/01/2016 Elsevier Patient Education  2020 [employer].

## 2020-06-07 NOTE — TOC Transition Note (Signed)
Transition of Care [facility]) - CM/SW Discharge Note   Patient Details  Name: Brian Duran MRN: [id number] Date of Birth: [birth year]  [facility] CM/SW Contact:  Glennon Mac, RN Phone Number: 06/07/2020,1200  Clinical Narrative:   Patient medically stable for discharge home today with girlfriend and mother to assist.  PT/OT recommending no outpatient follow-up or DME for home.  Patient uninsured, but is eligible for medication assistance with [facility] program.  [facility]  letter given with explanation of program benefits.  Patient has no PCP; follow-up appointment made for PCP/hospital follow-up at [facility] and [facility].  Appointment information placed on AVS.  Patient appreciative of assistance.  Final next level of care: Home/Self Care Barriers to Discharge: Barriers Resolved                         Discharge Plan and Services   Discharge Planning Services: Indigent Health Clinic, Follow-up appt scheduled, CM Consult, [facility] Program, Medication Assistance                                 Social Determinants of Health (SDOH) Interventions     Readmission Risk Interventions Readmission Risk Prevention Plan 06/07/2020  Post Dischage Appt Complete  Medication Screening Complete  Transportation Screening Complete   Quintella Baton, RN, BSN  Trauma/Neuro ICU Case Manager [phone]

## 2020-06-07 NOTE — Progress Notes (Signed)
Patient discharge teaching given, including activity, diet, follow-up appoints, and medications. Patient verbalized understanding of all discharge instructions. IV access was d/c'd. Vitals are stable. Skin is intact except as charted in most recent assessments. Pt to be escorted out by NT, to be driven home by family; pending case management consult.  Patient states unable to pay for medication and requires help and no PCP for discharge. Case Management made aware.
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
## 2020-06-07 NOTE — Progress Notes (Signed)
     [address]       [place] [address]             [phone]       Okay for discharge from CT surgery standpoint. Can follow-up in 2 weeks for staple removal.  Kendel Bessey O Kailynn Satterly

## 2020-06-10 ENCOUNTER — Other Ambulatory Visit: Payer: Self-pay

## 2020-06-10 ENCOUNTER — Encounter (INDEPENDENT_AMBULATORY_CARE_PROVIDER_SITE_OTHER): Payer: Self-pay

## 2020-06-10 DIAGNOSIS — Z4802 Encounter for removal of sutures: Secondary | ICD-10-CM

## 2020-06-16 ENCOUNTER — Other Ambulatory Visit: Payer: Self-pay | Admitting: Thoracic Surgery (Cardiothoracic Vascular Surgery)

## 2020-06-16 DIAGNOSIS — W3400XA Accidental discharge from unspecified firearms or gun, initial encounter: Secondary | ICD-10-CM

## 2020-06-17 ENCOUNTER — Ambulatory Visit
Admission: RE | Admit: 2020-06-17 | Discharge: 2020-06-17 | Disposition: A | Discharge status: Home / Self Care | Payer: No Typology Code available for payment source | Source: Ambulatory Visit | Attending: Thoracic Surgery (Cardiothoracic Vascular Surgery) | Admitting: Thoracic Surgery (Cardiothoracic Vascular Surgery)

## 2020-06-17 ENCOUNTER — Other Ambulatory Visit: Payer: Self-pay | Admitting: Thoracic Surgery (Cardiothoracic Vascular Surgery)

## 2020-06-17 ENCOUNTER — Other Ambulatory Visit: Payer: Self-pay

## 2020-06-17 ENCOUNTER — Encounter: Payer: Self-pay | Admitting: Thoracic Surgery (Cardiothoracic Vascular Surgery)

## 2020-06-17 ENCOUNTER — Ambulatory Visit (INDEPENDENT_AMBULATORY_CARE_PROVIDER_SITE_OTHER): Payer: No Typology Code available for payment source | Admitting: Thoracic Surgery (Cardiothoracic Vascular Surgery)

## 2020-06-17 VITALS — BP 125/77 | HR 60 | Temp 97.6°F | Resp 20 | Ht 71.0 in | Wt 177.0 lb

## 2020-06-17 DIAGNOSIS — W3400XA Accidental discharge from unspecified firearms or gun, initial encounter: Secondary | ICD-10-CM

## 2020-06-17 DIAGNOSIS — Z09 Encounter for follow-up examination after completed treatment for conditions other than malignant neoplasm: Secondary | ICD-10-CM

## 2020-06-17 DIAGNOSIS — J90 Pleural effusion, not elsewhere classified: Secondary | ICD-10-CM
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
## 2020-06-20 ENCOUNTER — Other Ambulatory Visit (HOSPITAL_COMMUNITY): Payer: Self-pay

## 2020-06-21 ENCOUNTER — Other Ambulatory Visit: Payer: Self-pay | Admitting: Thoracic Surgery (Cardiothoracic Vascular Surgery)

## 2020-06-22 ENCOUNTER — Ambulatory Visit (HOSPITAL_COMMUNITY): Admission: RE | Admit: 2020-06-22 | Payer: Self-pay | Source: Ambulatory Visit
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
## 2020-06-24 ENCOUNTER — Other Ambulatory Visit (HOSPITAL_COMMUNITY)
Admission: RE | Admit: 2020-06-24 | Discharge: 2020-06-24 | Disposition: A | Discharge status: Home / Self Care | Payer: Self-pay | Source: Ambulatory Visit | Attending: Thoracic Surgery (Cardiothoracic Vascular Surgery) | Admitting: Thoracic Surgery (Cardiothoracic Vascular Surgery)

## 2020-06-24 ENCOUNTER — Ambulatory Visit: Payer: Self-pay | Admitting: Cardiothoracic Surgery

## 2020-06-24 DIAGNOSIS — Z20822 Contact with and (suspected) exposure to covid-19: Secondary | ICD-10-CM | POA: Insufficient documentation

## 2020-06-24 DIAGNOSIS — Z01812 Encounter for preprocedural laboratory examination: Secondary | ICD-10-CM | POA: Insufficient documentation

## 2020-06-24 LAB — SARS CORONAVIRUS 2 (TAT 6-24 HRS): SARS Coronavirus 2: NEGATIVE

## 2020-06-27 ENCOUNTER — Other Ambulatory Visit: Payer: Self-pay | Admitting: Thoracic Surgery (Cardiothoracic Vascular Surgery)

## 2020-06-27 ENCOUNTER — Ambulatory Visit (HOSPITAL_COMMUNITY)
Admission: RE | Admit: 2020-06-27 | Discharge: 2020-06-27 | Disposition: A | Discharge status: Home / Self Care | Payer: Self-pay | Source: Ambulatory Visit | Attending: Thoracic Surgery (Cardiothoracic Vascular Surgery) | Admitting: Thoracic Surgery (Cardiothoracic Vascular Surgery)

## 2020-06-27 ENCOUNTER — Other Ambulatory Visit: Payer: Self-pay

## 2020-06-27 DIAGNOSIS — Z09 Encounter for follow-up examination after completed treatment for conditions other than malignant neoplasm: Secondary | ICD-10-CM | POA: Insufficient documentation

## 2020-06-27 MED ORDER — LIDOCAINE HCL 1 % IJ SOLN
INTRAMUSCULAR | Status: AC
  Filled 2020-06-27: qty 20

## 2020-06-27 NOTE — Progress Notes (Signed)
Patient presents for therapeutic thoracentesis. [place] limited chest shows trace amount of pleural effusion fluid noted  Insufficient to perform a safe therapeutic thoracentesis. Procedure not performed.

## 2020-06-29 ENCOUNTER — Ambulatory Visit: Payer: Self-pay | Attending: Physician Assistant | Admitting: Physician Assistant

## 2020-06-29 ENCOUNTER — Other Ambulatory Visit: Payer: Self-pay

## 2020-06-29 VITALS — BP 107/65 | HR 62 | Temp 98.2°F | Resp 16 | Ht 73.0 in | Wt 187.2 lb

## 2020-06-29 DIAGNOSIS — J189 Pneumonia, unspecified organism: Secondary | ICD-10-CM

## 2020-06-29 DIAGNOSIS — E876 Hypokalemia: Secondary | ICD-10-CM

## 2020-06-29 DIAGNOSIS — S36119D Unspecified injury of liver, subsequent encounter: Secondary | ICD-10-CM

## 2020-06-29 DIAGNOSIS — R739 Hyperglycemia, unspecified: Secondary | ICD-10-CM

## 2020-06-29 DIAGNOSIS — Z09 Encounter for follow-up examination after completed treatment for conditions other than malignant neoplasm: Secondary | ICD-10-CM

## 2020-06-29 DIAGNOSIS — D649 Anemia, unspecified: Secondary | ICD-10-CM

## 2020-06-29 DIAGNOSIS — J942 Hemothorax: Secondary | ICD-10-CM

## 2020-06-29 DIAGNOSIS — W3400XA Accidental discharge from unspecified firearms or gun, initial encounter: Secondary | ICD-10-CM

## 2021-02-26 ENCOUNTER — Ambulatory Visit (HOSPITAL_COMMUNITY)
Admission: EM | Admit: 2021-02-26 | Discharge: 2021-02-26 | Disposition: A | Discharge status: Home / Self Care | Payer: PRIVATE HEALTH INSURANCE | Attending: Medical Oncology | Admitting: Medical Oncology

## 2021-02-26 ENCOUNTER — Encounter (HOSPITAL_COMMUNITY): Payer: Self-pay

## 2021-02-26 ENCOUNTER — Other Ambulatory Visit: Payer: Self-pay

## 2021-02-26 DIAGNOSIS — G8929 Other chronic pain: Secondary | ICD-10-CM | POA: Diagnosis not present

## 2021-02-26 DIAGNOSIS — M545 Low back pain, unspecified: Secondary | ICD-10-CM | POA: Diagnosis not present

## 2021-02-26 MED ORDER — CYCLOBENZAPRINE HCL 5 MG PO TABS: 5.0000 mg | ORAL_TABLET | Freq: Three times a day (TID) | ORAL | 0 refills | Status: AC | PRN
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
## 2021-02-26 NOTE — Discharge Instructions (Addendum)
I would recommend that you consider PT or a spine specialist
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
## 2021-03-02 ENCOUNTER — Telehealth (HOSPITAL_BASED_OUTPATIENT_CLINIC_OR_DEPARTMENT_OTHER): Payer: Self-pay

## 2021-03-02 NOTE — Telephone Encounter (Signed)
-----   Message from Aaron Edelman, RN sent at 03/02/2021 12:24 PM EDT ----- Regarding: UC to PCP Patient needs to establish with PCP - routine
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
## 2023-01-02 ENCOUNTER — Ambulatory Visit (INDEPENDENT_AMBULATORY_CARE_PROVIDER_SITE_OTHER): Payer: PRIVATE HEALTH INSURANCE | Admitting: Family Medicine

## 2023-01-02 ENCOUNTER — Encounter: Payer: Self-pay | Admitting: Family Medicine

## 2023-01-02 VITALS — BP 100/68 | HR 53 | Ht 73.0 in | Wt 187.0 lb

## 2023-01-02 DIAGNOSIS — Z0000 Encounter for general adult medical examination without abnormal findings: Secondary | ICD-10-CM | POA: Diagnosis not present

## 2023-01-02 DIAGNOSIS — M5442 Lumbago with sciatica, left side: Secondary | ICD-10-CM

## 2023-01-02 DIAGNOSIS — Z23 Encounter for immunization: Secondary | ICD-10-CM

## 2023-01-02 DIAGNOSIS — Z1159 Encounter for screening for other viral diseases: Secondary | ICD-10-CM | POA: Diagnosis not present

## 2023-01-02 DIAGNOSIS — M5441 Lumbago with sciatica, right side: Secondary | ICD-10-CM

## 2023-01-02 DIAGNOSIS — G8929 Other chronic pain: Secondary | ICD-10-CM | POA: Diagnosis not present

## 2023-01-02 MED ORDER — DICLOFENAC SODIUM 1 % EX GEL: 2.0000 g | Freq: Four times a day (QID) | CUTANEOUS | 0 refills | Status: AC

## 2023-01-02 MED ORDER — LIDOCAINE 4 % EX PTCH: 1.0000 | MEDICATED_PATCH | CUTANEOUS | 0 refills | Status: AC
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
## 2023-01-02 NOTE — Patient Instructions (Addendum)
It was wonderful to see you today.  Please bring ALL of your medications with you to every visit.   Today we talked about:  Back pain - I have prescribed some topical things that can help like lidocaine patches and voltaren gel. Please look out for a phone call from physical therapy.   Please follow up in 6 months   Thank you for choosing [facility].   Please call [phone] with any questions about today's appointment.  Please be sure to schedule follow up at the front desk before you leave today.   Lowry Ram, MD  Family Medicine   Health Maintenance We like to think about ways to keep you healthy for years to come. Below are some interventions and screenings we can offer to keep you healthy: - COVID booster - Flu vaccine

## 2023-01-03 LAB — LIPID PANEL
Chol/HDL Ratio: 2.6 ratio (ref 0.0–5.0)
Cholesterol, Total: 153 mg/dL (ref 100–199)
HDL: 59 mg/dL (ref >39)
LDL Chol Calc (NIH): 81 mg/dL (ref 0–99)
Triglycerides: 66 mg/dL (ref 0–149)
VLDL Cholesterol Cal: 13 mg/dL (ref 5–40)

## 2023-01-03 LAB — COMPREHENSIVE METABOLIC PANEL
ALT: 23 IU/L (ref 0–44)
AST: 24 IU/L (ref 0–40)
Albumin/Globulin Ratio: 2.1 (ref 1.2–2.2)
Albumin: 4.4 g/dL (ref 4.1–5.1)
Alkaline Phosphatase: 81 IU/L (ref 44–121)
BUN/Creatinine Ratio: 8 — ABNORMAL LOW (ref 9–20)
BUN: 8 mg/dL (ref 6–20)
Bilirubin Total: 0.4 mg/dL (ref 0.0–1.2)
CO2: 27 mmol/L (ref 20–29)
Calcium: 9.3 mg/dL (ref 8.7–10.2)
Chloride: 107 mmol/L — ABNORMAL HIGH (ref 96–106)
Creatinine, Ser: 1 mg/dL (ref 0.76–1.27)
Globulin, Total: 2.1 g/dL (ref 1.5–4.5)
Glucose: 83 mg/dL (ref 70–99)
Potassium: 4.6 mmol/L (ref 3.5–5.2)
Sodium: 145 mmol/L — ABNORMAL HIGH (ref 134–144)
Total Protein: 6.5 g/dL (ref 6.0–8.5)
eGFR: 99 mL/min/{1.73_m2} (ref >59)

## 2023-01-03 LAB — HEPATITIS C ANTIBODY: Hep C Virus Ab: REACTIVE — AB

## 2023-01-05 DIAGNOSIS — G8929 Other chronic pain: Secondary | ICD-10-CM | POA: Insufficient documentation
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
## 2023-01-08 ENCOUNTER — Telehealth: Payer: Self-pay | Admitting: Family Medicine

## 2023-01-08 NOTE — Progress Notes (Signed)
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

## 2023-01-08 NOTE — Telephone Encounter (Signed)
Called patient to discuss positive HCV. Left VM.

## 2024-02-23 ENCOUNTER — Emergency Department (HOSPITAL_COMMUNITY)
Admission: EM | Admit: 2024-02-23 | Discharge: 2024-02-27 | Disposition: E | Payer: Self-pay | Attending: Emergency Medicine | Admitting: Emergency Medicine

## 2024-02-23 DIAGNOSIS — I469 Cardiac arrest, cause unspecified: Secondary | ICD-10-CM

## 2024-02-23 DIAGNOSIS — W3400XA Accidental discharge from unspecified firearms or gun, initial encounter: Secondary | ICD-10-CM

## 2024-02-23 DIAGNOSIS — S0100XA Unspecified open wound of scalp, initial encounter: Secondary | ICD-10-CM | POA: Insufficient documentation

## 2024-02-23 DIAGNOSIS — S41101A Unspecified open wound of right upper arm, initial encounter: Secondary | ICD-10-CM | POA: Insufficient documentation

## 2024-02-23 DIAGNOSIS — Y249XXA Unspecified firearm discharge, undetermined intent, initial encounter: Secondary | ICD-10-CM
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
## 2024-02-27 DEATH — deceased
